# Patient Record
Sex: Female | Born: 1950
Health system: Southern US, Community
[De-identification: ages and names within clinical notes are randomized; demographics above are authoritative.]

## PROBLEM LIST (undated history)

## (undated) DIAGNOSIS — H409 Unspecified glaucoma: Secondary | ICD-10-CM

## (undated) DIAGNOSIS — E785 Hyperlipidemia, unspecified: Secondary | ICD-10-CM

## (undated) DIAGNOSIS — I10 Essential (primary) hypertension: Secondary | ICD-10-CM

## (undated) DIAGNOSIS — Z973 Presence of spectacles and contact lenses: Secondary | ICD-10-CM

## (undated) DIAGNOSIS — E559 Vitamin D deficiency, unspecified: Secondary | ICD-10-CM

## (undated) DIAGNOSIS — B019 Varicella without complication: Secondary | ICD-10-CM

## (undated) HISTORY — DX: Hyperlipidemia, unspecified: E78.5

## (undated) HISTORY — DX: Essential (primary) hypertension: I10

## (undated) HISTORY — DX: Varicella without complication: B01.9

## (undated) HISTORY — DX: Unspecified glaucoma: H40.9

## (undated) HISTORY — DX: Presence of spectacles and contact lenses: Z97.3

## (undated) HISTORY — DX: Vitamin D deficiency, unspecified: E55.9

---

## 2016-08-07 DIAGNOSIS — Z13220 Encounter for screening for lipoid disorders: Secondary | ICD-10-CM | POA: Diagnosis not present

## 2016-08-07 DIAGNOSIS — I10 Essential (primary) hypertension: Secondary | ICD-10-CM | POA: Diagnosis not present

## 2016-08-07 DIAGNOSIS — Z6830 Body mass index (BMI) 30.0-30.9, adult: Secondary | ICD-10-CM | POA: Diagnosis not present

## 2016-09-29 DIAGNOSIS — Z1231 Encounter for screening mammogram for malignant neoplasm of breast: Secondary | ICD-10-CM | POA: Diagnosis not present

## 2016-09-29 DIAGNOSIS — N6489 Other specified disorders of breast: Secondary | ICD-10-CM | POA: Diagnosis not present

## 2016-10-13 DIAGNOSIS — Z7182 Exercise counseling: Secondary | ICD-10-CM | POA: Diagnosis not present

## 2016-10-13 DIAGNOSIS — Z1389 Encounter for screening for other disorder: Secondary | ICD-10-CM | POA: Diagnosis not present

## 2016-10-13 DIAGNOSIS — Z7189 Other specified counseling: Secondary | ICD-10-CM | POA: Diagnosis not present

## 2016-10-13 DIAGNOSIS — I10 Essential (primary) hypertension: Secondary | ICD-10-CM | POA: Diagnosis not present

## 2016-10-13 DIAGNOSIS — Z713 Dietary counseling and surveillance: Secondary | ICD-10-CM | POA: Diagnosis not present

## 2016-10-13 DIAGNOSIS — Z0000 Encounter for general adult medical examination without abnormal findings: Secondary | ICD-10-CM | POA: Diagnosis not present

## 2016-10-13 DIAGNOSIS — Z6830 Body mass index (BMI) 30.0-30.9, adult: Secondary | ICD-10-CM | POA: Diagnosis not present

## 2016-10-13 DIAGNOSIS — Z2821 Immunization not carried out because of patient refusal: Secondary | ICD-10-CM | POA: Diagnosis not present

## 2016-10-22 DIAGNOSIS — R922 Inconclusive mammogram: Secondary | ICD-10-CM | POA: Diagnosis not present

## 2016-10-22 DIAGNOSIS — N6489 Other specified disorders of breast: Secondary | ICD-10-CM | POA: Diagnosis not present

## 2017-02-04 DIAGNOSIS — Z6830 Body mass index (BMI) 30.0-30.9, adult: Secondary | ICD-10-CM | POA: Diagnosis not present

## 2017-02-04 DIAGNOSIS — G4700 Insomnia, unspecified: Secondary | ICD-10-CM | POA: Diagnosis not present

## 2017-02-04 DIAGNOSIS — I10 Essential (primary) hypertension: Secondary | ICD-10-CM | POA: Diagnosis not present

## 2017-05-27 DIAGNOSIS — J029 Acute pharyngitis, unspecified: Secondary | ICD-10-CM | POA: Diagnosis not present

## 2017-09-28 ENCOUNTER — Ambulatory Visit (INDEPENDENT_AMBULATORY_CARE_PROVIDER_SITE_OTHER): Payer: Medicare Other | Admitting: Internal Medicine

## 2017-09-28 ENCOUNTER — Encounter: Payer: Self-pay | Admitting: Internal Medicine

## 2017-09-28 VITALS — BP 168/98 | HR 65 | Temp 98.2°F | Ht 63.75 in | Wt 157.4 lb

## 2017-09-28 DIAGNOSIS — L309 Dermatitis, unspecified: Secondary | ICD-10-CM | POA: Diagnosis not present

## 2017-09-28 DIAGNOSIS — I10 Essential (primary) hypertension: Secondary | ICD-10-CM

## 2017-09-28 DIAGNOSIS — Z1329 Encounter for screening for other suspected endocrine disorder: Secondary | ICD-10-CM | POA: Diagnosis not present

## 2017-09-28 DIAGNOSIS — Z1231 Encounter for screening mammogram for malignant neoplasm of breast: Secondary | ICD-10-CM | POA: Diagnosis not present

## 2017-09-28 LAB — CBC WITH DIFFERENTIAL/PLATELET
BASOS ABS: 0.1 10*3/uL (ref 0.0–0.1)
Basophils Relative: 1 % (ref 0.0–3.0)
Eosinophils Absolute: 0.1 10*3/uL (ref 0.0–0.7)
Eosinophils Relative: 1.4 % (ref 0.0–5.0)
HCT: 42.7 % (ref 36.0–46.0)
Hemoglobin: 14.3 g/dL (ref 12.0–15.0)
LYMPHS ABS: 1.9 10*3/uL (ref 0.7–4.0)
Lymphocytes Relative: 29.8 % (ref 12.0–46.0)
MCHC: 33.5 g/dL (ref 30.0–36.0)
MCV: 85.8 fl (ref 78.0–100.0)
MONO ABS: 0.5 10*3/uL (ref 0.1–1.0)
MONOS PCT: 8 % (ref 3.0–12.0)
NEUTROS ABS: 3.7 10*3/uL (ref 1.4–7.7)
NEUTROS PCT: 59.8 % (ref 43.0–77.0)
PLATELETS: 294 10*3/uL (ref 150.0–400.0)
RBC: 4.98 Mil/uL (ref 3.87–5.11)
RDW: 13.8 % (ref 11.5–15.5)
WBC: 6.2 10*3/uL (ref 4.0–10.5)

## 2017-09-28 LAB — COMPREHENSIVE METABOLIC PANEL
ALK PHOS: 64 U/L (ref 39–117)
ALT: 16 U/L (ref 0–35)
AST: 15 U/L (ref 0–37)
Albumin: 4.2 g/dL (ref 3.5–5.2)
BILIRUBIN TOTAL: 0.7 mg/dL (ref 0.2–1.2)
BUN: 14 mg/dL (ref 6–23)
CO2: 30 meq/L (ref 19–32)
Calcium: 9.7 mg/dL (ref 8.4–10.5)
Chloride: 103 mEq/L (ref 96–112)
Creatinine, Ser: 0.81 mg/dL (ref 0.40–1.20)
GFR: 75.11 mL/min (ref >60.00)
GLUCOSE: 112 mg/dL — AB (ref 70–99)
Potassium: 4.1 mEq/L (ref 3.5–5.1)
SODIUM: 139 meq/L (ref 135–145)
TOTAL PROTEIN: 7.1 g/dL (ref 6.0–8.3)

## 2017-09-28 LAB — POCT URINALYSIS DIPSTICK
Bilirubin, UA: NEGATIVE
Blood, UA: NEGATIVE
Glucose, UA: NEGATIVE
KETONES UA: NEGATIVE
Leukocytes, UA: NEGATIVE
Nitrite, UA: NEGATIVE
PH UA: 7 (ref 5.0–8.0)
PROTEIN UA: NEGATIVE
SPEC GRAV UA: 1.02 (ref 1.010–1.025)
UROBILINOGEN UA: 0.2 U/dL

## 2017-09-28 LAB — LIPID PANEL
CHOLESTEROL: 206 mg/dL — AB (ref 0–200)
HDL: 50.4 mg/dL (ref >39.00)
LDL Cholesterol: 129 mg/dL — ABNORMAL HIGH (ref 0–99)
NONHDL: 155.16
Total CHOL/HDL Ratio: 4
Triglycerides: 130 mg/dL (ref 0.0–149.0)
VLDL: 26 mg/dL (ref 0.0–40.0)

## 2017-09-28 LAB — TSH: TSH: 3.27 u[IU]/mL (ref 0.35–4.50)

## 2017-09-28 LAB — T4, FREE: FREE T4: 0.76 ng/dL (ref 0.60–1.60)

## 2017-09-28 MED ORDER — AMLODIPINE BESYLATE 5 MG PO TABS
5.0000 mg | ORAL_TABLET | Freq: Every day | ORAL | 0 refills | Status: DC
Start: 2017-09-28 — End: 2017-09-29

## 2017-09-28 NOTE — Progress Notes (Addendum)
Chief Complaint  Patient presents with  . Establish Care  . Hypertension   New Patient 1. Needs refill on BP med with move to [place] 5-6 months ago has not established PCP and been out of Metoprolol 25 qd x 1 week. She has been on this medication x 15 years and Lisinopril caused cough where she could not talk. Home BP readings have been 160s/70s.  2. No other complaints here to establish care   Hypertension  Pertinent negatives include no chest pain or shortness of breath.   Review of Systems  Constitutional: Positive for weight loss.       Intentionally lost 12 # via exercise   Respiratory: Negative for shortness of breath.   Cardiovascular: Negative for chest pain.  Gastrointestinal: Negative for abdominal pain and blood in stool.  Psychiatric/Behavioral: Negative for depression.   Past Medical History:  Diagnosis Date  . Chicken pox   . Hypertension    Past Surgical History:  Procedure Laterality Date  . CESAREAN SECTION     x 1    Family History  Problem Relation Age of Onset  . Stroke Mother   . Hypertension Mother   . Diabetes Mother   . Cancer Father        prostate   . Hyperlipidemia Father   . Diabetes Father   . Hyperlipidemia Other        grandparents    Social History   Socioeconomic History  . Marital status: Married    Spouse name: Not on file  . Number of children: Not on file  . Years of education: Not on file  . Highest education level: Not on file  Social Needs  . Financial resource strain: Not on file  . Food insecurity - worry: Not on file  . Food insecurity - inability: Not on file  . Transportation needs - medical: Not on file  . Transportation needs - non-medical: Not on file  Occupational History  . Occupation: retired  Tobacco Use  . Smoking status: Never Smoker  . Smokeless tobacco: Never Used  Substance and Sexual Activity  . Alcohol use: Yes    Comment: occasional  . Drug use: No  . Sexual activity: Not on file  Other Topics  Concern  . Not on file  Social History Narrative   Retired (previous jobs Science writer, private investigation fraud, [employer]   Lived in [place] for a while with husband who is from there   Lived all over [place]. From [place]    1 son lives in [place] now but lived [place] prior with 1 grandson in [place] and 1 grandson in [place]. Wine denies cig/drugs    Likes to exercise    Sexually active with current husband since age 75 y.o    Current Meds  Medication Sig  . magnesium 30 MG tablet Take 30 mg daily by mouth.  . vitamin B-12 (CYANOCOBALAMIN) 100 MCG tablet Take 100 mcg daily by mouth.  . [DISCONTINUED] metoprolol tartrate (LOPRESSOR) 25 mg/10 mL SUSP Take by mouth.   Allergies  Allergen Reactions  . Lisinopril     Cough    Vitals:   09/28/17 0910  Weight: 157 lb 6 oz (71.4 kg)  Height: 5' 3.75" (1.619 m)   Recent Results (from the past 2160 hour(s))  POCT urinalysis dipstick     Status: Normal   Collection Time: 09/28/17 10:19 AM  Result Value Ref Range   Color, UA yellow  Clarity, UA clear    Glucose, UA negative    Bilirubin, UA negative    Ketones, UA negative    Spec Grav, UA 1.020 1.010 - 1.025   Blood, UA negative    pH, UA 7.0 5.0 - 8.0   Protein, UA negative    Urobilinogen, UA 0.2 0.2 or 1.0 E.U./dL   Nitrite, UA negative    Leukocytes, UA Negative Negative   Vitals:   09/28/17 0910  BP: (!) 168/98  Pulse: 65  Temp: 98.2 F (36.8 C)  SpO2: 98%   Objective  Physical Exam  Constitutional: She is oriented to person, place, and time and well-developed, well-nourished, and in no distress.  HENT:  Head: Normocephalic and atraumatic.  Mouth/Throat: Oropharynx is clear and moist and mucous membranes are normal.  Eyes: Conjunctivae are normal. Pupils are equal, round, and reactive to light.  Cardiovascular: Normal rate, regular rhythm, S1 normal, S2 normal and normal heart sounds.  Neg leg edema b/l   Pulmonary/Chest: Effort normal and breath sounds  normal.  Abdominal: Soft. Bowel sounds are normal.  Neurological: She is alert and oriented to person, place, and time. Gait normal.  Skin: Skin is warm and dry.  Erythema to neck mild   Psychiatric: Mood, memory, affect and judgment normal.  Nursing note and vitals reviewed.  Assessment   1. HTN, uncontrolled  2. Mild eczematous change to neck ? etiology  3. HM Plan  1. Add norvasc 5 mg do 2.5 mg qam x 3 days then 5 mg qd  D/c Metoprol 25 mg qd  Reviewed records 10/09/17 prev she was on norvasc 5 mg qd, hctz 12.5 mg qd, Metoprolol 25 mg bid not related also on Ambien 5 mg qhs   F/u in 2 weeks  Labs today CMET, CBC, lipid, UA, TSH, free T4  Consider Hep B/C screen in future declines STD check been with husband since age 51 y.o  -she is unsure if previous PCP did above  2.try OTC HC if not resolved will do stronger steroid at f/ u 3.declines flu shot  Due for Tdap had been 10 years Will disc prevnar and pna 23 and shingrix at f/u  mammo last had 10/2016 dense breast tissue per pt will order repeat but need to get records from [place]  -records obtained and reviewed 10/09/17 neg   Colonoscopy 5 years ago need records from [facility] as well as [place] records of DEXA  Do pap at f/u last 10/2015 no h/o abnormal and LMP 08/2005  Due for eye exam last had 2015 wears glasses  Need to get PCP records [facility] Dr. Jaci Lazier in [place]   Provider: Dr. Olivia Mackie McLean-Scocuzza

## 2017-09-28 NOTE — Patient Instructions (Addendum)
1. Please follow up in 2 weeks can schedule pap smear at this visit  2. Start Norvasc 5 mg (Take 1/2 pill in am x 3 days then 1 pill in am)  3. We need to get records from Dr. Jaci Lazier and mammogram in [place] and colonoscopy in [place]  4. Will check labs today  5. Will refer for mammogram  Hypertension Hypertension is another name for high blood pressure. High blood pressure forces your heart to work harder to pump blood. This can cause problems over time. There are two numbers in a blood pressure reading. There is a top number (systolic) over a bottom number (diastolic). It is best to have a blood pressure below 120/80. Healthy choices can help lower your blood pressure. You may need medicine to help lower your blood pressure if:  Your blood pressure cannot be lowered with healthy choices.  Your blood pressure is higher than 130/80.  Follow these instructions at home: Eating and drinking  If directed, follow the DASH eating plan. This diet includes: ? Filling half of your plate at each meal with fruits and vegetables. ? Filling one quarter of your plate at each meal with whole grains. Whole grains include whole wheat pasta, brown rice, and whole grain bread. ? Eating or drinking low-fat dairy products, such as skim milk or low-fat yogurt. ? Filling one quarter of your plate at each meal with low-fat (lean) proteins. Low-fat proteins include fish, skinless chicken, eggs, beans, and tofu. ? Avoiding fatty meat, cured and processed meat, or chicken with skin. ? Avoiding premade or processed food.  Eat less than 1,500 mg of salt (sodium) a day.  Limit alcohol use to no more than 1 drink a day for nonpregnant women and 2 drinks a day for men. One drink equals 12 oz of beer, 5 oz of wine, or 1 oz of hard liquor. Lifestyle  Work with your doctor to stay at a healthy weight or to lose weight. Ask your doctor what the best weight is for you.  Get at least 30 minutes of exercise that causes  your heart to beat faster (aerobic exercise) most days of the week. This may include walking, swimming, or biking.  Get at least 30 minutes of exercise that strengthens your muscles (resistance exercise) at least 3 days a week. This may include lifting weights or pilates.  Do not use any products that contain nicotine or tobacco. This includes cigarettes and e-cigarettes. If you need help quitting, ask your doctor.  Check your blood pressure at home as told by your doctor.  Keep all follow-up visits as told by your doctor. This is important. Medicines  Take over-the-counter and prescription medicines only as told by your doctor. Follow directions carefully.  Do not skip doses of blood pressure medicine. The medicine does not work as well if you skip doses. Skipping doses also puts you at risk for problems.  Ask your doctor about side effects or reactions to medicines that you should watch for. Contact a doctor if:  You think you are having a reaction to the medicine you are taking.  You have headaches that keep coming back (recurring).  You feel dizzy.  You have swelling in your ankles.  You have trouble with your vision. Get help right away if:  You get a very bad headache.  You start to feel confused.  You feel weak or numb.  You feel faint.  You get very bad pain in your: ? Chest. ? Belly (  abdomen).  You throw up (vomit) more than once.  You have trouble breathing. Summary  Hypertension is another name for high blood pressure.  Making healthy choices can help lower blood pressure. If your blood pressure cannot be controlled with healthy choices, you may need to take medicine. This information is not intended to replace advice given to you by your health care provider. Make sure you discuss any questions you have with your health care provider. Document Released: 04/14/2008 Document Revised: 09/24/2016 Document Reviewed: 09/24/2016 Elsevier Interactive Patient  Education  [employer].

## 2017-09-29 ENCOUNTER — Telehealth: Payer: Self-pay | Admitting: Internal Medicine

## 2017-09-29 MED ORDER — AMLODIPINE BESYLATE 5 MG PO TABS: 5.0000 mg | ORAL_TABLET | Freq: Every day | ORAL | 0 refills | Status: DC

## 2017-09-29 NOTE — Telephone Encounter (Signed)
Pt called to make aware prescription was printed at the office and could be faxed to [facility] as previously requested. Pt states she would rather pick up the printed prescription from the office rather than having it faxed to pharmacy. Trisha,Flow Coordinator made aware and will have prescription ready for pick up for the pt today Pt voiced no other concerns at this time.

## 2017-09-29 NOTE — Addendum Note (Signed)
Addended by: Orland Mustard on: 09/29/2017 02:51 PM   Modules accepted: Orders

## 2017-09-29 NOTE — Telephone Encounter (Signed)
Copied from [place] [id number]. Topic: Quick Communication - See Telephone Encounter >> Sep 29, 2017  1:29 PM Vernona Rieger wrote: CRM for notification. See Telephone encounter for:  Patient is calling stating that she would like the Dr to write script for amLODipine 5mg  tablets, she likes to shop around for the cheapest and so she wants to know if someone could call her back. She seen Dr Aundra Dubin yesterday & she prescribed her this & it was going to be sent to [facility].   09/29/17.

## 2017-10-12 ENCOUNTER — Ambulatory Visit: Payer: PRIVATE HEALTH INSURANCE | Admitting: Internal Medicine

## 2017-10-19 ENCOUNTER — Ambulatory Visit: Payer: PRIVATE HEALTH INSURANCE | Admitting: Internal Medicine

## 2017-10-22 ENCOUNTER — Other Ambulatory Visit (HOSPITAL_COMMUNITY)
Admission: RE | Admit: 2017-10-22 | Discharge: 2017-10-22 | Disposition: A | Discharge status: Home / Self Care | Payer: Medicare Other | Source: Ambulatory Visit | Attending: Internal Medicine | Admitting: Internal Medicine

## 2017-10-22 ENCOUNTER — Ambulatory Visit (INDEPENDENT_AMBULATORY_CARE_PROVIDER_SITE_OTHER): Payer: Medicare Other | Admitting: Internal Medicine

## 2017-10-22 ENCOUNTER — Encounter: Payer: Self-pay | Admitting: Internal Medicine

## 2017-10-22 VITALS — BP 160/86 | HR 75 | Temp 98.4°F | Ht 63.75 in | Wt 157.4 lb

## 2017-10-22 DIAGNOSIS — M546 Pain in thoracic spine: Secondary | ICD-10-CM

## 2017-10-22 DIAGNOSIS — Z124 Encounter for screening for malignant neoplasm of cervix: Secondary | ICD-10-CM | POA: Insufficient documentation

## 2017-10-22 DIAGNOSIS — I10 Essential (primary) hypertension: Secondary | ICD-10-CM | POA: Diagnosis not present

## 2017-10-22 DIAGNOSIS — E785 Hyperlipidemia, unspecified: Secondary | ICD-10-CM | POA: Insufficient documentation

## 2017-10-22 DIAGNOSIS — M549 Dorsalgia, unspecified: Secondary | ICD-10-CM | POA: Insufficient documentation

## 2017-10-22 MED ORDER — CHLORTHALIDONE 25 MG PO TABS: 12.5000 mg | ORAL_TABLET | Freq: Every day | ORAL | 0 refills | Status: DC

## 2017-10-22 MED ORDER — AMLODIPINE BESYLATE 5 MG PO TABS: 2.5000 mg | ORAL_TABLET | Freq: Two times a day (BID) | ORAL | 0 refills | Status: DC

## 2017-10-22 NOTE — Patient Instructions (Addendum)
You can try Red yeast rice for cholesterol but no benefit other than lowering cholesterol like statin. Statin can lower cholesterol and reduce risk of stroke and heart attack  Take Norvasc 5 mg (1/2 pill=2.5 mg)2x per day  Add Chlorthalidone 12.5 mg in am  Follow up in 2 weeks  Remember to get eyes checked    Cholesterol Cholesterol is a white, waxy, fat-like substance that is needed by the human body in small amounts. The liver makes all the cholesterol we need. Cholesterol is carried from the liver by the blood through the blood vessels. Deposits of cholesterol (plaques) may build up on blood vessel (artery) walls. Plaques make the arteries narrower and stiffer. Cholesterol plaques increase the risk for heart attack and stroke. You cannot feel your cholesterol level even if it is very high. The only way to know that it is high is to have a blood test. Once you know your cholesterol levels, you should keep a record of the test results. Work with your health care provider to keep your levels in the desired range. What do the results mean?  Total cholesterol is a rough measure of all the cholesterol in your blood.  LDL (low-density lipoprotein) is the "bad" cholesterol. This is the type that causes plaque to build up on the artery walls. You want this level to be low.  HDL (high-density lipoprotein) is the "good" cholesterol because it cleans the arteries and carries the LDL away. You want this level to be high.  Triglycerides are fat that the body can either burn for energy or store. High levels are closely linked to heart disease. What are the desired levels of cholesterol?  Total cholesterol below 200.  LDL below 100 for people who are at risk, below 70 for people at very high risk.  HDL above 40 is good. A level of 60 or higher is considered to be protective against heart disease.  Triglycerides below 150. How can I lower my cholesterol? Diet Follow your diet program as told by  your health care provider.  Choose fish or white meat chicken and [place], roasted or baked. Limit fatty cuts of red meat, fried foods, and processed meats, such as sausage and lunch meats.  Eat lots of fresh fruits and vegetables.  Choose whole grains, beans, pasta, potatoes, and cereals.  Choose olive oil, corn oil, or canola oil, and use only small amounts.  Avoid butter, mayonnaise, shortening, or palm kernel oils.  Avoid foods with trans fats.  Drink skim or nonfat milk and eat low-fat or nonfat yogurt and cheeses. Avoid whole milk, cream, ice cream, egg yolks, and full-fat cheeses.  Healthier desserts include angel food cake, ginger snaps, animal crackers, hard candy, popsicles, and low-fat or nonfat frozen yogurt. Avoid pastries, cakes, pies, and cookies.  Exercise  Follow your exercise program as told by your health care provider. A regular program: ? Helps to decrease LDL and raise HDL. ? Helps with weight control.  Do things that increase your activity level, such as gardening, walking, and taking the stairs.  Ask your health care provider about ways that you can be more active in your daily life.  Medicine  Take over-the-counter and prescription medicines only as told by your health care provider. ? Medicine may be prescribed by your health care provider to help lower cholesterol and decrease the risk for heart disease. This is usually done if diet and exercise have failed to bring down cholesterol levels. ? If you have several  risk factors, you may need medicine even if your levels are normal.  This information is not intended to replace advice given to you by your health care provider. Make sure you discuss any questions you have with your health care provider. Document Released: 07/22/2001 Document Revised: 05/24/2016 Document Reviewed: 04/26/2016 Elsevier Interactive Patient Education  2017 [employer].  Chlorthalidone tablets What is this  medicine? CHLORTHALIDONE (klor THAL i done) is a diuretic. It increases the amount of urine passed, which causes the body to lose salt and water. This medicine is used to treat high blood pressure and edema or water retention. This medicine may be used for other purposes; ask your health care provider or pharmacist if you have questions. COMMON BRAND NAME(S): Thalitone What should I tell my health care provider before I take this medicine? They need to know if you have any of these conditions: -asthma -diabetes -gout -kidney disease -liver disease -parathyroid disease -systemic lupus erythematosus (SLE) -taking cortisone, digoxin, lithium carbonate, or drugs for diabetes -an unusual or allergic reaction to chlorthalidone, sulfa drugs, other medicines, foods, dyes, or preservatives -pregnant or trying to get pregnant -breast-feeding How should I use this medicine? Take this medicine by mouth with a glass of water. Follow the directions on the prescription label. It is best to take your dose in the morning with food. Take your medicine at regular intervals. Do not take your medicine more often than directed. Do not stop taking except on your doctor's advice. Talk to your pediatrician regarding the use of this medicine in children. Special care may be needed. Overdosage: If you think you have taken too much of this medicine contact a poison control center or emergency room at once. NOTE: This medicine is only for you. Do not share this medicine with others. What if I miss a dose? If you miss a dose, take it as soon as you can. If it is almost time for your next dose, take only that dose. Do not take double or extra doses. What may interact with this medicine? -barbiturate medicines for sleep or seizure control -digoxin -lithium -medicines for diabetes -norepinephrine -other medicines for high blood pressure -some pain medicines -steroid hormones like prednisone, cortisone,  hydrocortisone, corticotropin -tubocurarine This list may not describe all possible interactions. Give your health care provider a list of all the medicines, herbs, non-prescription drugs, or dietary supplements you use. Also tell them if you smoke, drink alcohol, or use illegal drugs. Some items may interact with your medicine. What should I watch for while using this medicine? Visit your doctor or health care professional for regular check ups. Check your blood pressure as directed. Ask your doctor or health care professional what your blood pressure should be and when you should contact him or her. You may need to be on a special diet while taking this medicine. Ask your doctor. You may get drowsy or dizzy. Do not drive, use machinery, or do anything that needs mental alertness until you know how this medicine affects you. Do not stand or sit up quickly, especially if you are an older patient. This reduces the risk of dizzy or fainting spells. Alcohol may interfere with the effect of this medicine. Avoid alcoholic drinks. This medicine may affect your blood sugar level. If you have diabetes, check with your doctor or health care professional before changing the dose of your diabetic medicine. This medicine can make you more sensitive to the sun. Keep out of the sun. If you cannot avoid  being in the sun, wear protective clothing and use sunscreen. Do not use sun lamps or tanning beds/booths. What side effects may I notice from receiving this medicine? Side effects that you should report to your doctor or health care professional as soon as possible: -allergic reactions like skin rash, itching or hives, swelling of the face, lips, or tongue -dark urine -dry mouth -excess thirst -fast, irregular heart rate -fever, chills -muscle pain, cramps, or spasm -nausea, vomiting -redness, blistering, peeling or loosening of the skin, including inside the mouth -tingling, pain or numbness in the hands or  feet -unusually weak or tired -yellowing of the eyes or skin Side effects that usually do not require medical attention (report to your doctor or health care professional if they continue or are bothersome): -diarrhea or constipation -headache -impotence -loss of appetite -stomach upset This list may not describe all possible side effects. Call your doctor for medical advice about side effects. You may report side effects to FDA at 1-800-FDA-1088. Where should I keep my medicine? Keep out of the reach of children. Store at room temperature between 15 and 30 degrees C (59 and 86 degrees F). Keep container tightly closed. Throw away any unused medicine after the expiration date. NOTE: This sheet is a summary. It may not cover all possible information. If you have questions about this medicine, talk to your doctor, pharmacist, or health care provider.  2018 Elsevier/Gold Standard (2008-02-01 15:28:48)

## 2017-10-22 NOTE — Progress Notes (Addendum)
Chief Complaint  Patient presents with  . pap screening   F/u  1. HTN not controlled on Norvasc taking 2.5 mg bid instead of 5 mg qd b/c she feels like she needs control in the pm . Reviewed prev. Notes and was on Metoprolol 25 mg bid and HCT 12.5 mg and norvasc 5 mg  2. C/o mid back pain x 2 days dull pain she thinks she slept the wrong way and grandson has been sleeping with her  3. Here for pap today  4. Reviewed labs cholesterol elevated pt wants to try lifestyle changes 1st and doesn't really want to take statin.     Review of Systems  Respiratory: Negative for shortness of breath.   Cardiovascular: Negative for chest pain.  Musculoskeletal: Positive for back pain.  Skin: Negative for rash.  Neurological: Negative for headaches.  Psychiatric/Behavioral: The patient has insomnia.    Past Medical History:  Diagnosis Date  . Chicken pox   . Hypertension   . Wears glasses    Past Surgical History:  Procedure Laterality Date  . CESAREAN SECTION     x 1    Family History  Problem Relation Age of Onset  . Stroke Mother   . Hypertension Mother   . Diabetes Mother   . Cancer Father        prostate   . Hyperlipidemia Father   . Diabetes Father   . Hyperlipidemia Other        grandparents    Social History   Socioeconomic History  . Marital status: Married    Spouse name: Not on file  . Number of children: Not on file  . Years of education: Not on file  . Highest education level: Not on file  Social Needs  . Financial resource strain: Not on file  . Food insecurity - worry: Not on file  . Food insecurity - inability: Not on file  . Transportation needs - medical: Not on file  . Transportation needs - non-medical: Not on file  Occupational History  . Occupation: retired  Tobacco Use  . Smoking status: Never Smoker  . Smokeless tobacco: Never Used  Substance and Sexual Activity  . Alcohol use: Yes    Comment: occasional  . Drug use: No  . Sexual activity: Not  on file  Other Topics Concern  . Not on file  Social History Narrative   Retired (previous jobs Science writer, private investigation fraud, [employer]   Lived in [place] for a while with husband who is from there   Lived all over [place]. From [place]    1 son lives in [place] now but lived [place] prior with 1 grandson in [place] and 1 grandson in [place]. Wine denies cig/drugs    Likes to exercise    Sexually active with current husband since age 81 y.o    Family origin from [place]  . amLODipine (NORVASC) 5 MG tablet Take 0.5 tablets (2.5 mg total) by mouth 2 (two) times daily. Take 1/2 tablet 2x per day  . magnesium 30 MG tablet Take 30 mg daily by mouth.  . vitamin B-12 (CYANOCOBALAMIN) 100 MCG tablet Take 100 mcg daily by mouth.  . [DISCONTINUED] amLODipine (NORVASC) 5 MG tablet Take 1 tablet (5 mg total) by mouth daily. Take 1/2 tablet x 3 days then 1 tablet qam   Allergies  Allergen Reactions  . Lisinopril  Cough    Recent Results (from the past 2160 hour(s))  Comprehensive metabolic panel     Status: Abnormal   Collection Time: 09/28/17 10:02 AM  Result Value Ref Range   Sodium 139 135 - 145 mEq/L   Potassium 4.1 3.5 - 5.1 mEq/L   Chloride 103 96 - 112 mEq/L   CO2 30 19 - 32 mEq/L   Glucose, Bld 112 (H) 70 - 99 mg/dL   BUN 14 6 - 23 mg/dL   Creatinine, Ser 0.81 0.40 - 1.20 mg/dL   Total Bilirubin 0.7 0.2 - 1.2 mg/dL   Alkaline Phosphatase 64 39 - 117 U/L   AST 15 0 - 37 U/L   ALT 16 0 - 35 U/L   Total Protein 7.1 6.0 - 8.3 g/dL   Albumin 4.2 3.5 - 5.2 g/dL   Calcium 9.7 8.4 - 10.5 mg/dL   GFR 75.11 >60.00 mL/min  CBC with Differential/Platelet     Status: None   Collection Time: 09/28/17 10:02 AM  Result Value Ref Range   WBC 6.2 4.0 - 10.5 K/uL   RBC 4.98 3.87 - 5.11 Mil/uL   Hemoglobin 14.3 12.0 - 15.0 g/dL   HCT 42.7 36.0 - 46.0 %   MCV 85.8 78.0 - 100.0 fl   MCHC 33.5 30.0 - 36.0 g/dL   RDW 13.8 11.5 - 15.5 %    Platelets 294.0 150.0 - 400.0 K/uL   Neutrophils Relative % 59.8 43.0 - 77.0 %   Lymphocytes Relative 29.8 12.0 - 46.0 %   Monocytes Relative 8.0 3.0 - 12.0 %   Eosinophils Relative 1.4 0.0 - 5.0 %   Basophils Relative 1.0 0.0 - 3.0 %   Neutro Abs 3.7 1.4 - 7.7 K/uL   Lymphs Abs 1.9 0.7 - 4.0 K/uL   Monocytes Absolute 0.5 0.1 - 1.0 K/uL   Eosinophils Absolute 0.1 0.0 - 0.7 K/uL   Basophils Absolute 0.1 0.0 - 0.1 K/uL  Lipid panel     Status: Abnormal   Collection Time: 09/28/17 10:02 AM  Result Value Ref Range   Cholesterol 206 (H) 0 - 200 mg/dL    Comment: ATP III Classification       Desirable:  < 200 mg/dL               Borderline High:  200 - 239 mg/dL          High:  > = 240 mg/dL   Triglycerides 130.0 0.0 - 149.0 mg/dL    Comment: Normal:  <150 mg/dLBorderline High:  150 - 199 mg/dL   HDL 50.40 >39.00 mg/dL   VLDL 26.0 0.0 - 40.0 mg/dL   LDL Cholesterol 129 (H) 0 - 99 mg/dL   Total CHOL/HDL Ratio 4     Comment:                Men          Women1/2 Average Risk     3.4          3.3Average Risk          5.0          4.42X Average Risk          9.6          7.13X Average Risk          15.0          11.0  NonHDL 155.16     Comment: NOTE:  Non-HDL goal should be 30 mg/dL higher than patient's LDL goal (i.e. LDL goal of < 70 mg/dL, would have non-HDL goal of < 100 mg/dL)  T4, free     Status: None   Collection Time: 09/28/17 10:02 AM  Result Value Ref Range   Free T4 0.76 0.60 - 1.60 ng/dL    Comment: Specimens from patients who are undergoing biotin therapy and /or ingesting biotin supplements may contain high levels of biotin.  The higher biotin concentration in these specimens interferes with this Free T4 assay.  Specimens that contain high levels  of biotin may cause false high results for this Free T4 assay.  Please interpret results in light of the total clinical presentation of the patient.    TSH     Status: None   Collection Time: 09/28/17 10:02 AM   Result Value Ref Range   TSH 3.27 0.35 - 4.50 uIU/mL  POCT urinalysis dipstick     Status: Normal   Collection Time: 09/28/17 10:19 AM  Result Value Ref Range   Color, UA yellow    Clarity, UA clear    Glucose, UA negative    Bilirubin, UA negative    Ketones, UA negative    Spec Grav, UA 1.020 1.010 - 1.025   Blood, UA negative    pH, UA 7.0 5.0 - 8.0   Protein, UA negative    Urobilinogen, UA 0.2 0.2 or 1.0 E.U./dL   Nitrite, UA negative    Leukocytes, UA Negative Negative   Objective  Body mass index is 27.23 kg/m. Wt Readings from Last 3 Encounters:  10/22/17 157 lb 6 oz (71.4 kg)  09/28/17 157 lb 6 oz (71.4 kg)   Temp Readings from Last 3 Encounters:  10/22/17 98.4 F (36.9 C) (Oral)  09/28/17 98.2 F (36.8 C) (Oral)   BP Readings from Last 3 Encounters:  10/22/17 (!) 160/86  09/28/17 (!) 168/98   Pulse Readings from Last 3 Encounters:  10/22/17 75  09/28/17 65   O2 room air 98%  Physical Exam  Constitutional: She is oriented to person, place, and time and well-developed, well-nourished, and in no distress.  HENT:  Head: Normocephalic and atraumatic.  Mouth/Throat: Oropharynx is clear and moist and mucous membranes are normal.  Eyes: Conjunctivae are normal. Pupils are equal, round, and reactive to light.  Cardiovascular: Normal rate, regular rhythm and normal heart sounds.  Pulmonary/Chest: Effort normal and breath sounds normal. Right breast exhibits no inverted nipple, no mass, no nipple discharge, no skin change and no tenderness. Left breast exhibits no inverted nipple, no mass, no nipple discharge, no skin change and no tenderness. Breasts are symmetrical.  Neg axillary mass b/l   Abdominal: Soft. Bowel sounds are normal. There is no tenderness.  Genitourinary: Vagina normal, uterus normal, cervix normal, right adnexa normal, left adnexa normal and vulva normal. Cervix exhibits no motion tenderness. No vaginal discharge found.  Neurological: She is  alert and oriented to person, place, and time. Gait normal. Gait normal.  Skin: Skin is warm and dry.  Psychiatric: Mood, memory, affect and judgment normal.  Nursing note and vitals reviewed.  Assessment   1. HTN/HLD  2. Screening pap  3. Mid back pain  4. HM Plan  1.  Pt does not want to increase norvasc dose 2/2 potential side effect leg edema will do Norvasc 2.5 mg bid pt wants to cut 5 mg pill in 1/2 instead of 2.5  mg dose bid   Add chlorthalidone 12.5 mg qd. May need to titrate up if can tolerate  Vs  Add back BB I.e coreg was on Metoprolol 25 mg bid but Coreg does better BP control   Disc lifestyle changes (she already exercises, diet needs to change)  if lipid not improving rec statin. Also disc red yeast rice but no outcome benefit heart attack or stroke. She prefers to look into red yeast rice  Given up to date cholesterol handout   F/u BP in 2 weeks  2. Pap today if normal no need for any further  3. Monitor if continues will need Xray  Prn Tylenol, heat  4.  Declines flu vaccine, declines pnuemonia vaccines.  Tdap due Summer 2019 had 9 years ago  Disc shingrix at f/u   mammo sch 10/27/17  Need to get copy of DEXA obtained and had 09/07/99 T score 0.8 low risk consider repeat DEXA in future given this was done in 2000 and not age 30, colon[place]   Pap today   rec get eye exam she normally goes to [facility] Provider: Dr. Olivia Mackie McLean-Scocuzza-Internal Medicine

## 2017-10-26 LAB — CYTOLOGY - PAP
Diagnosis: NEGATIVE
HPV (WINDOPATH): NOT DETECTED

## 2017-10-26 NOTE — Addendum Note (Signed)
Addended by: Orland Mustard on: 10/26/2017 10:13 PM   Modules accepted: Level of Service

## 2017-10-27 ENCOUNTER — Ambulatory Visit
Admission: RE | Admit: 2017-10-27 | Discharge: 2017-10-27 | Disposition: A | Discharge status: Home / Self Care | Payer: Medicare Other | Source: Ambulatory Visit | Attending: Internal Medicine | Admitting: Internal Medicine

## 2017-10-27 DIAGNOSIS — Z1231 Encounter for screening mammogram for malignant neoplasm of breast: Secondary | ICD-10-CM | POA: Insufficient documentation

## 2017-11-12 ENCOUNTER — Telehealth: Payer: Self-pay | Admitting: Internal Medicine

## 2017-11-12 NOTE — Telephone Encounter (Signed)
Placed in your folder for review. 

## 2017-11-12 NOTE — Telephone Encounter (Signed)
Pt dropped off osteoporosis screening for Dr. Olivia Mackie. Paper is up front in Dr. Audrie Gallus color folder.

## 2017-11-27 ENCOUNTER — Encounter: Payer: Self-pay | Admitting: Internal Medicine

## 2017-11-27 ENCOUNTER — Ambulatory Visit (INDEPENDENT_AMBULATORY_CARE_PROVIDER_SITE_OTHER): Payer: Medicare Other | Admitting: Internal Medicine

## 2017-11-27 VITALS — BP 124/68 | HR 91 | Temp 98.8°F | Ht 63.75 in | Wt 153.5 lb

## 2017-11-27 DIAGNOSIS — E785 Hyperlipidemia, unspecified: Secondary | ICD-10-CM | POA: Diagnosis not present

## 2017-11-27 DIAGNOSIS — I10 Essential (primary) hypertension: Secondary | ICD-10-CM

## 2017-11-27 MED ORDER — AMLODIPINE BESYLATE 5 MG PO TABS: 2.5000 mg | ORAL_TABLET | Freq: Every day | ORAL | 1 refills | Status: DC

## 2017-11-27 MED ORDER — CHLORTHALIDONE 25 MG PO TABS: 25.0000 mg | ORAL_TABLET | Freq: Every day | ORAL | 1 refills | Status: DC

## 2017-11-27 NOTE — Progress Notes (Signed)
Chief Complaint  Patient presents with  . Follow-up   Follow up  1. HTN doing well pt has been taking 1 whole pill chlorthalidone 25 mg qd and norvasc 1/2 pill of 5 mg (2.5) mg bid BP at home has been running 115/65 she has been somewhat lightheaded instructions last visit were 2.5 mg bid and 1/2 chlorthalidone but pt's bp doing well on this dose will continue for nwo    Review of Systems  Constitutional: Negative for weight loss.  Respiratory: Negative for shortness of breath.   Cardiovascular: Negative for chest pain.  Gastrointestinal: Negative for abdominal pain.  Genitourinary: Positive for frequency.  Musculoskeletal: Negative for back pain.   Past Medical History:  Diagnosis Date  . Chicken pox   . Hypertension   . Wears glasses    Past Surgical History:  Procedure Laterality Date  . CESAREAN SECTION     x 1    Family History  Problem Relation Age of Onset  . Stroke Mother   . Hypertension Mother   . Diabetes Mother   . Cancer Father        prostate   . Hyperlipidemia Father   . Diabetes Father   . Hyperlipidemia Other        grandparents   . Breast cancer Neg Hx    Social History   Socioeconomic History  . Marital status: Married    Spouse name: Not on file  . Number of children: Not on file  . Years of education: Not on file  . Highest education level: Not on file  Social Needs  . Financial resource strain: Not on file  . Food insecurity - worry: Not on file  . Food insecurity - inability: Not on file  . Transportation needs - medical: Not on file  . Transportation needs - non-medical: Not on file  Occupational History  . Occupation: retired  Tobacco Use  . Smoking status: Never Smoker  . Smokeless tobacco: Never Used  Substance and Sexual Activity  . Alcohol use: Yes    Comment: occasional  . Drug use: No  . Sexual activity: Not on file  Other Topics Concern  . Not on file  Social History Narrative   Retired (previous jobs Science writer, private  investigation fraud, [employer]   Lived in [place] for a while with husband who is from there   Lived all over [place]. From [place]    1 son lives in [place] now but lived [place] prior with 1 grandson in [place] and 1 grandson in [place]. Wine denies cig/drugs    Likes to exercise    Sexually active with current husband since age 10 y.o    Family origin from [place]    No outpatient medications have been marked as taking for the 11/27/17 encounter (Office Visit) with McLean-Scocuzza, Nino Glow, MD.   Allergies  Allergen Reactions  . Lisinopril     Cough    Recent Results (from the past 2160 hour(s))  Comprehensive metabolic panel     Status: Abnormal   Collection Time: 09/28/17 10:02 AM  Result Value Ref Range   Sodium 139 135 - 145 mEq/L   Potassium 4.1 3.5 - 5.1 mEq/L   Chloride 103 96 - 112 mEq/L   CO2 30 19 - 32 mEq/L   Glucose, Bld 112 (H) 70 - 99 mg/dL   BUN 14 6 - 23 mg/dL   Creatinine, Ser 0.81 0.40 - 1.20 mg/dL  Total Bilirubin 0.7 0.2 - 1.2 mg/dL   Alkaline Phosphatase 64 39 - 117 U/L   AST 15 0 - 37 U/L   ALT 16 0 - 35 U/L   Total Protein 7.1 6.0 - 8.3 g/dL   Albumin 4.2 3.5 - 5.2 g/dL   Calcium 9.7 8.4 - 10.5 mg/dL   GFR 75.11 >60.00 mL/min  CBC with Differential/Platelet     Status: None   Collection Time: 09/28/17 10:02 AM  Result Value Ref Range   WBC 6.2 4.0 - 10.5 K/uL   RBC 4.98 3.87 - 5.11 Mil/uL   Hemoglobin 14.3 12.0 - 15.0 g/dL   HCT 42.7 36.0 - 46.0 %   MCV 85.8 78.0 - 100.0 fl   MCHC 33.5 30.0 - 36.0 g/dL   RDW 13.8 11.5 - 15.5 %   Platelets 294.0 150.0 - 400.0 K/uL   Neutrophils Relative % 59.8 43.0 - 77.0 %   Lymphocytes Relative 29.8 12.0 - 46.0 %   Monocytes Relative 8.0 3.0 - 12.0 %   Eosinophils Relative 1.4 0.0 - 5.0 %   Basophils Relative 1.0 0.0 - 3.0 %   Neutro Abs 3.7 1.4 - 7.7 K/uL   Lymphs Abs 1.9 0.7 - 4.0 K/uL   Monocytes Absolute 0.5 0.1 - 1.0 K/uL   Eosinophils Absolute 0.1 0.0 - 0.7 K/uL   Basophils Absolute 0.1  0.0 - 0.1 K/uL  Lipid panel     Status: Abnormal   Collection Time: 09/28/17 10:02 AM  Result Value Ref Range   Cholesterol 206 (H) 0 - 200 mg/dL    Comment: ATP III Classification       Desirable:  < 200 mg/dL               Borderline High:  200 - 239 mg/dL          High:  > = 240 mg/dL   Triglycerides 130.0 0.0 - 149.0 mg/dL    Comment: Normal:  <150 mg/dLBorderline High:  150 - 199 mg/dL   HDL 50.40 >39.00 mg/dL   VLDL 26.0 0.0 - 40.0 mg/dL   LDL Cholesterol 129 (H) 0 - 99 mg/dL   Total CHOL/HDL Ratio 4     Comment:                Men          Women1/2 Average Risk     3.4          3.3Average Risk          5.0          4.42X Average Risk          9.6          7.13X Average Risk          15.0          11.0                       NonHDL 155.16     Comment: NOTE:  Non-HDL goal should be 30 mg/dL higher than patient's LDL goal (i.e. LDL goal of < 70 mg/dL, would have non-HDL goal of < 100 mg/dL)  T4, free     Status: None   Collection Time: 09/28/17 10:02 AM  Result Value Ref Range   Free T4 0.76 0.60 - 1.60 ng/dL    Comment: Specimens from patients who are undergoing biotin therapy and /or ingesting biotin supplements may contain high levels of biotin.  The higher biotin concentration in these specimens interferes with this Free T4 assay.  Specimens that contain high levels  of biotin may cause false high results for this Free T4 assay.  Please interpret results in light of the total clinical presentation of the patient.    TSH     Status: None   Collection Time: 09/28/17 10:02 AM  Result Value Ref Range   TSH 3.27 0.35 - 4.50 uIU/mL  POCT urinalysis dipstick     Status: Normal   Collection Time: 09/28/17 10:19 AM  Result Value Ref Range   Color, UA yellow    Clarity, UA clear    Glucose, UA negative    Bilirubin, UA negative    Ketones, UA negative    Spec Grav, UA 1.020 1.010 - 1.025   Blood, UA negative    pH, UA 7.0 5.0 - 8.0   Protein, UA negative    Urobilinogen, UA 0.2 0.2  or 1.0 E.U./dL   Nitrite, UA negative    Leukocytes, UA Negative Negative  Cytology - PAP     Status: None   Collection Time: 10/22/17 12:00 AM  Result Value Ref Range   Adequacy      Satisfactory for evaluation  endocervical/transformation zone component PRESENT.   Diagnosis      NEGATIVE FOR INTRAEPITHELIAL LESIONS OR MALIGNANCY.   HPV NOT DETECTED     Comment: Normal Reference Range - NOT Detected   Material Submitted CervicoVaginal Pap [ThinPrep Imaged]    Objective  Body mass index is 26.56 kg/m. Wt Readings from Last 3 Encounters:  11/27/17 153 lb 8 oz (69.6 kg)  10/22/17 157 lb 6 oz (71.4 kg)  09/28/17 157 lb 6 oz (71.4 kg)   Temp Readings from Last 3 Encounters:  11/27/17 98.8 F (37.1 C) (Oral)  10/22/17 98.4 F (36.9 C) (Oral)  09/28/17 98.2 F (36.8 C) (Oral)   BP Readings from Last 3 Encounters:  11/27/17 124/68  10/22/17 (!) 160/86  09/28/17 (!) 168/98   Pulse Readings from Last 3 Encounters:  11/27/17 91  10/22/17 75  09/28/17 65   O2 sat room air 97%  Physical Exam  Constitutional: She is oriented to person, place, and time and well-developed, well-nourished, and in no distress. Vital signs are normal.  HENT:  Head: Normocephalic and atraumatic.  Mouth/Throat: Oropharynx is clear and moist and mucous membranes are normal.  Eyes: Conjunctivae are normal. Pupils are equal, round, and reactive to light.  Cardiovascular: Normal rate, regular rhythm and normal heart sounds.  Pulmonary/Chest: Effort normal and breath sounds normal.  Neurological: She is alert and oriented to person, place, and time. Gait normal. Gait normal.  Skin: Skin is warm, dry and intact.  Psychiatric: Mood, memory, affect and judgment normal.  Nursing note and vitals reviewed.   Assessment   1. HTN/HLD 2. Hm  Plan  1. Check BMET at f/u with lipid and consider statin at f/u if not better lipid panel  Cont chlorthalidone 25 mg qd and Norvasc 2.5 change from bid to qd  2.  Declines flu shot  Hold on shingrix for now disc but pt does not like vaccines  mammo neg 10/27/17  Consider repeat DEXA in future had 2000 normal  Pt willing to do cologaurd in future, pending records [place] 5 [place] ago  Pap 10/28/17 neg neg HPV  Still needs to get eye exam   Provider: Dr. Olivia Mackie McLean-Scocuzza-Internal Medicine

## 2017-11-27 NOTE — Patient Instructions (Signed)
You were taking Norvasc 2.5 mg (1/2 tablet) 2x per day try 2.5 mg (1/2 tablet) 1x per day in the am  With Chlorthalidone 25 mg daily  Take care  See you in 3 months  I know you dont like vaccines but if you are interested consider shingrix for shingles vaccine  Log your blood pressure at home  Consider repeat bone density scan  Try to get colonoscopy from [facility]  Hypertension Hypertension, commonly called high blood pressure, is when the force of blood pumping through the arteries is too strong. The arteries are the blood vessels that carry blood from the heart throughout the body. Hypertension forces the heart to work harder to pump blood and may cause arteries to become narrow or stiff. Having untreated or uncontrolled hypertension can cause heart attacks, strokes, kidney disease, and other problems. A blood pressure reading consists of a higher number over a lower number. Ideally, your blood pressure should be below 120/80. The first ("top") number is called the systolic pressure. It is a measure of the pressure in your arteries as your heart beats. The second ("bottom") number is called the diastolic pressure. It is a measure of the pressure in your arteries as the heart relaxes. What are the causes? The cause of this condition is not known. What increases the risk? Some risk factors for high blood pressure are under your control. Others are not. Factors you can change  Smoking.  Having type 2 diabetes mellitus, high cholesterol, or both.  Not getting enough exercise or physical activity.  Being overweight.  Having too much fat, sugar, calories, or salt (sodium) in your diet.  Drinking too much alcohol. Factors that are difficult or impossible to change  Having chronic kidney disease.  Having a family history of high blood pressure.  Age. Risk increases with age.  Race. You may be at higher risk if you are African-American.  Gender. Men are at higher risk than women before age  7. After age 20, women are at higher risk than men.  Having obstructive sleep apnea.  Stress. What are the signs or symptoms? Extremely high blood pressure (hypertensive crisis) may cause:  Headache.  Anxiety.  Shortness of breath.  Nosebleed.  Nausea and vomiting.  Severe chest pain.  Jerky movements you cannot control (seizures).  How is this diagnosed? This condition is diagnosed by measuring your blood pressure while you are seated, with your arm resting on a surface. The cuff of the blood pressure monitor will be placed directly against the skin of your upper arm at the level of your heart. It should be measured at least twice using the same arm. Certain conditions can cause a difference in blood pressure between your right and left arms. Certain factors can cause blood pressure readings to be lower or higher than normal (elevated) for a short period of time:  When your blood pressure is higher when you are in a health care provider's office than when you are at home, this is called white coat hypertension. Most people with this condition do not need medicines.  When your blood pressure is higher at home than when you are in a health care provider's office, this is called masked hypertension. Most people with this condition may need medicines to control blood pressure.  If you have a high blood pressure reading during one visit or you have normal blood pressure with other risk factors:  You may be asked to return on a different day to have your  blood pressure checked again.  You may be asked to monitor your blood pressure at home for 1 week or longer.  If you are diagnosed with hypertension, you may have other blood or imaging tests to help your health care provider understand your overall risk for other conditions. How is this treated? This condition is treated by making healthy lifestyle changes, such as eating healthy foods, exercising more, and reducing your alcohol  intake. Your health care provider may prescribe medicine if lifestyle changes are not enough to get your blood pressure under control, and if:  Your systolic blood pressure is above 130.  Your diastolic blood pressure is above 80.  Your personal target blood pressure may vary depending on your medical conditions, your age, and other factors. Follow these instructions at home: Eating and drinking  Eat a diet that is high in fiber and potassium, and low in sodium, added sugar, and fat. An example eating plan is called the DASH (Dietary Approaches to Stop Hypertension) diet. To eat this way: ? Eat plenty of fresh fruits and vegetables. Try to fill half of your plate at each meal with fruits and vegetables. ? Eat whole grains, such as whole wheat pasta, brown rice, or whole grain bread. Fill about one quarter of your plate with whole grains. ? Eat or drink low-fat dairy products, such as skim milk or low-fat yogurt. ? Avoid fatty cuts of meat, processed or cured meats, and poultry with skin. Fill about one quarter of your plate with lean proteins, such as fish, chicken without skin, beans, eggs, and tofu. ? Avoid premade and processed foods. These tend to be higher in sodium, added sugar, and fat.  Reduce your daily sodium intake. Most people with hypertension should eat less than 1,500 mg of sodium a day.  Limit alcohol intake to no more than 1 drink a day for nonpregnant women and 2 drinks a day for men. One drink equals 12 oz of beer, 5 oz of wine, or 1 oz of hard liquor. Lifestyle  Work with your health care provider to maintain a healthy body weight or to lose weight. Ask what an ideal weight is for you.  Get at least 30 minutes of exercise that causes your heart to beat faster (aerobic exercise) most days of the week. Activities may include walking, swimming, or biking.  Include exercise to strengthen your muscles (resistance exercise), such as pilates or lifting weights, as part of your  weekly exercise routine. Try to do these types of exercises for 30 minutes at least 3 days a week.  Do not use any products that contain nicotine or tobacco, such as cigarettes and e-cigarettes. If you need help quitting, ask your health care provider.  Monitor your blood pressure at home as told by your health care provider.  Keep all follow-up visits as told by your health care provider. This is important. Medicines  Take over-the-counter and prescription medicines only as told by your health care provider. Follow directions carefully. Blood pressure medicines must be taken as prescribed.  Do not skip doses of blood pressure medicine. Doing this puts you at risk for problems and can make the medicine less effective.  Ask your health care provider about side effects or reactions to medicines that you should watch for. Contact a health care provider if:  You think you are having a reaction to a medicine you are taking.  You have headaches that keep coming back (recurring).  You feel dizzy.  You have  swelling in your ankles.  You have trouble with your vision. Get help right away if:  You develop a severe headache or confusion.  You have unusual weakness or numbness.  You feel faint.  You have severe pain in your chest or abdomen.  You vomit repeatedly.  You have trouble breathing. Summary  Hypertension is when the force of blood pumping through your arteries is too strong. If this condition is not controlled, it may put you at risk for serious complications.  Your personal target blood pressure may vary depending on your medical conditions, your age, and other factors. For most people, a normal blood pressure is less than 120/80.  Hypertension is treated with lifestyle changes, medicines, or a combination of both. Lifestyle changes include weight loss, eating a healthy, low-sodium diet, exercising more, and limiting alcohol. This information is not intended to replace  advice given to you by your health care provider. Make sure you discuss any questions you have with your health care provider. Document Released: 10/27/2005 Document Revised: 09/24/2016 Document Reviewed: 09/24/2016 Elsevier Interactive Patient Education  [employer].

## 2018-01-14 ENCOUNTER — Telehealth: Payer: Self-pay | Admitting: Internal Medicine

## 2018-01-14 DIAGNOSIS — I10 Essential (primary) hypertension: Secondary | ICD-10-CM

## 2018-01-14 MED ORDER — AMLODIPINE BESYLATE 5 MG PO TABS: 2.5000 mg | ORAL_TABLET | Freq: Every day | ORAL | 1 refills | Status: DC

## 2018-01-14 NOTE — Addendum Note (Signed)
Addended by: Elpidio Galea T on: 01/14/2018 10:38 AM   Modules accepted: Orders

## 2018-01-14 NOTE — Telephone Encounter (Signed)
Copied from [employer] [id number]. Topic: Quick Communication - Rx Refill/Question >> Jan 14, 2018  9:24 AM Ether Griffins B wrote: Medication: amLODipine (NORVASC) 5 MG tablet           chlorthalidone (HYGROTON) 25 MG tablet  Pt requesting scripts be printed out so she can pick up and find what pharmacy has it cheapest    Agent: Please be advised that RX refills may take up to 3 business days. We ask that you follow-up with your pharmacy.

## 2018-01-14 NOTE — Telephone Encounter (Signed)
medication has been refilled. 

## 2018-01-21 ENCOUNTER — Other Ambulatory Visit: Payer: Self-pay

## 2018-01-21 DIAGNOSIS — I10 Essential (primary) hypertension: Secondary | ICD-10-CM

## 2018-01-21 MED ORDER — AMLODIPINE BESYLATE 5 MG PO TABS: 2.5000 mg | ORAL_TABLET | Freq: Every day | ORAL | 1 refills | Status: DC

## 2018-01-21 MED ORDER — CHLORTHALIDONE 25 MG PO TABS: 25.0000 mg | ORAL_TABLET | Freq: Every day | ORAL | 1 refills | Status: DC

## 2018-03-05 ENCOUNTER — Other Ambulatory Visit: Payer: Self-pay

## 2018-03-05 ENCOUNTER — Encounter: Payer: Self-pay | Admitting: Internal Medicine

## 2018-03-05 ENCOUNTER — Ambulatory Visit (INDEPENDENT_AMBULATORY_CARE_PROVIDER_SITE_OTHER): Payer: Medicare Other | Admitting: Internal Medicine

## 2018-03-05 VITALS — BP 128/76 | HR 76 | Temp 97.9°F | Ht 63.75 in | Wt 156.4 lb

## 2018-03-05 DIAGNOSIS — Z1211 Encounter for screening for malignant neoplasm of colon: Secondary | ICD-10-CM

## 2018-03-05 DIAGNOSIS — E559 Vitamin D deficiency, unspecified: Secondary | ICD-10-CM | POA: Diagnosis not present

## 2018-03-05 DIAGNOSIS — R739 Hyperglycemia, unspecified: Secondary | ICD-10-CM | POA: Diagnosis not present

## 2018-03-05 DIAGNOSIS — E785 Hyperlipidemia, unspecified: Secondary | ICD-10-CM | POA: Diagnosis not present

## 2018-03-05 DIAGNOSIS — E2839 Other primary ovarian failure: Secondary | ICD-10-CM | POA: Diagnosis not present

## 2018-03-05 DIAGNOSIS — I10 Essential (primary) hypertension: Secondary | ICD-10-CM | POA: Diagnosis not present

## 2018-03-05 LAB — BASIC METABOLIC PANEL
BUN: 22 mg/dL (ref 6–23)
CALCIUM: 9.6 mg/dL (ref 8.4–10.5)
CO2: 29 meq/L (ref 19–32)
Chloride: 99 mEq/L (ref 96–112)
Creatinine, Ser: 0.79 mg/dL (ref 0.40–1.20)
GFR: 77.21 mL/min (ref >60.00)
Glucose, Bld: 163 mg/dL — ABNORMAL HIGH (ref 70–99)
POTASSIUM: 3.4 meq/L — AB (ref 3.5–5.1)
SODIUM: 138 meq/L (ref 135–145)

## 2018-03-05 LAB — VITAMIN D 25 HYDROXY (VIT D DEFICIENCY, FRACTURES): VITD: 23.41 ng/mL — AB (ref 30.00–100.00)

## 2018-03-05 NOTE — Patient Instructions (Addendum)
Please do cologaurd on Monday We will sch DEXA Try Cetaphil or Cerave Cream to skin  Try Neutrogena Ultrasheer lotion SPF 30 to face and spray to body  Vitamin D3 at least 1000 IU daily and calcium 600 mg 2x per day Please schedule no food x 12 hours, cholesterol and A1C check 05/2018  Take care  F/u 09/2018   Pneumococcal Conjugate Vaccine suspension for injection What is this medicine? PNEUMOCOCCAL VACCINE (NEU mo KOK al vak SEEN) is a vaccine used to prevent pneumococcus bacterial infections. These bacteria can cause serious infections like pneumonia, meningitis, and blood infections. This vaccine will lower your chance of getting pneumonia. If you do get pneumonia, it can make your symptoms milder and your illness shorter. This vaccine will not treat an infection and will not cause infection. This vaccine is recommended for infants and young children, adults with certain medical conditions, and adults 72 years or older. This medicine may be used for other purposes; ask your health care provider or pharmacist if you have questions. COMMON BRAND NAME(S): Prevnar, Prevnar 13 What should I tell my health care provider before I take this medicine? They need to know if you have any of these conditions: -bleeding problems -fever -immune system problems -an unusual or allergic reaction to pneumococcal vaccine, diphtheria toxoid, other vaccines, latex, other medicines, foods, dyes, or preservatives -pregnant or trying to get pregnant -breast-feeding How should I use this medicine? This vaccine is for injection into a muscle. It is given by a health care professional. A copy of Vaccine Information Statements will be given before each vaccination. Read this sheet carefully each time. The sheet may change frequently. Talk to your pediatrician regarding the use of this medicine in children. While this drug may be prescribed for children as young as 47 weeks old for selected conditions, precautions do  apply. Overdosage: If you think you have taken too much of this medicine contact a poison control center or emergency room at once. NOTE: This medicine is only for you. Do not share this medicine with others. What if I miss a dose? It is important not to miss your dose. Call your doctor or health care professional if you are unable to keep an appointment. What may interact with this medicine? -medicines for cancer chemotherapy -medicines that suppress your immune function -steroid medicines like prednisone or cortisone This list may not describe all possible interactions. Give your health care provider a list of all the medicines, herbs, non-prescription drugs, or dietary supplements you use. Also tell them if you smoke, drink alcohol, or use illegal drugs. Some items may interact with your medicine. What should I watch for while using this medicine? Mild fever and pain should go away in 3 days or less. Report any unusual symptoms to your doctor or health care professional. What side effects may I notice from receiving this medicine? Side effects that you should report to your doctor or health care professional as soon as possible: -allergic reactions like skin rash, itching or hives, swelling of the face, lips, or tongue -breathing problems -confused -fast or irregular heartbeat -fever over 102 degrees F -seizures -unusual bleeding or bruising -unusual muscle weakness Side effects that usually do not require medical attention (report to your doctor or health care professional if they continue or are bothersome): -aches and pains -diarrhea -fever of 102 degrees F or less -headache -irritable -loss of appetite -pain, tender at site where injected -trouble sleeping This list may not describe all possible side  effects. Call your doctor for medical advice about side effects. You may report side effects to FDA at 1-800-FDA-1088. Where should I keep my medicine? This does not apply. This  vaccine is given in a clinic, pharmacy, doctor's office, or other health care setting and will not be stored at home. NOTE: This sheet is a summary. It may not cover all possible information. If you have questions about this medicine, talk to your doctor, pharmacist, or health care provider.  2018 Elsevier/Gold Standard (2014-08-03 10:27:27)    Cholesterol Cholesterol is a white, waxy, fat-like substance that is needed by the human body in small amounts. The liver makes all the cholesterol we need. Cholesterol is carried from the liver by the blood through the blood vessels. Deposits of cholesterol (plaques) may build up on blood vessel (artery) walls. Plaques make the arteries narrower and stiffer. Cholesterol plaques increase the risk for heart attack and stroke. You cannot feel your cholesterol level even if it is very high. The only way to know that it is high is to have a blood test. Once you know your cholesterol levels, you should keep a record of the test results. Work with your health care provider to keep your levels in the desired range. What do the results mean?  Total cholesterol is a rough measure of all the cholesterol in your blood.  LDL (low-density lipoprotein) is the "bad" cholesterol. This is the type that causes plaque to build up on the artery walls. You want this level to be low.  HDL (high-density lipoprotein) is the "good" cholesterol because it cleans the arteries and carries the LDL away. You want this level to be high.  Triglycerides are fat that the body can either burn for energy or store. High levels are closely linked to heart disease. What are the desired levels of cholesterol?  Total cholesterol below 200.  LDL below 100 for people who are at risk, below 70 for people at very high risk.  HDL above 40 is good. A level of 60 or higher is considered to be protective against heart disease.  Triglycerides below 150. How can I lower my  cholesterol? Diet Follow your diet program as told by your health care provider.  Choose fish or white meat chicken and [place], roasted or baked. Limit fatty cuts of red meat, fried foods, and processed meats, such as sausage and lunch meats.  Eat lots of fresh fruits and vegetables.  Choose whole grains, beans, pasta, potatoes, and cereals.  Choose olive oil, corn oil, or canola oil, and use only small amounts.  Avoid butter, mayonnaise, shortening, or palm kernel oils.  Avoid foods with trans fats.  Drink skim or nonfat milk and eat low-fat or nonfat yogurt and cheeses. Avoid whole milk, cream, ice cream, egg yolks, and full-fat cheeses.  Healthier desserts include angel food cake, ginger snaps, animal crackers, hard candy, popsicles, and low-fat or nonfat frozen yogurt. Avoid pastries, cakes, pies, and cookies.  Exercise  Follow your exercise program as told by your health care provider. A regular program: ? Helps to decrease LDL and raise HDL. ? Helps with weight control.  Do things that increase your activity level, such as gardening, walking, and taking the stairs.  Ask your health care provider about ways that you can be more active in your daily life.  Medicine  Take over-the-counter and prescription medicines only as told by your health care provider. ? Medicine may be prescribed by your health care provider to help  lower cholesterol and decrease the risk for heart disease. This is usually done if diet and exercise have failed to bring down cholesterol levels. ? If you have several risk factors, you may need medicine even if your levels are normal.  This information is not intended to replace advice given to you by your health care provider. Make sure you discuss any questions you have with your health care provider. Document Released: 07/22/2001 Document Revised: 05/24/2016 Document Reviewed: 04/26/2016 Elsevier Interactive Patient Education  [employer].

## 2018-03-05 NOTE — Progress Notes (Signed)
Chief Complaint  Patient presents with  . Follow-up   F/u  1. HTN controlled on Norvasc 2.5 mg qd, chorthalidone 25 mg qd controlled  2. HLD will get repeat lipid 05/2018    Review of Systems  Constitutional: Negative for weight loss.  HENT: Negative for hearing loss.   Eyes: Negative for blurred vision.  Respiratory: Negative for shortness of breath.   Cardiovascular: Negative for chest pain.  Gastrointestinal: Negative for abdominal pain.  Musculoskeletal: Negative for falls.  Skin: Negative for rash.  Neurological: Negative for headaches.  Psychiatric/Behavioral: Negative for depression.   Past Medical History:  Diagnosis Date  . Chicken pox   . Hypertension   . Wears glasses    Past Surgical History:  Procedure Laterality Date  . CESAREAN SECTION     x 1    Family History  Problem Relation Age of Onset  . Stroke Mother   . Hypertension Mother   . Diabetes Mother   . Cancer Father        prostate   . Hyperlipidemia Father   . Diabetes Father   . Hyperlipidemia Other        grandparents   . Breast cancer Neg Hx    Social History   Socioeconomic History  . Marital status: Married    Spouse name: Not on file  . Number of children: Not on file  . Years of education: Not on file  . Highest education level: Not on file  Occupational History  . Occupation: retired  Scientific laboratory technician  . Financial resource strain: Not on file  . Food insecurity:    Worry: Not on file    Inability: Not on file  . Transportation needs:    Medical: Not on file    Non-medical: Not on file  Tobacco Use  . Smoking status: Never Smoker  . Smokeless tobacco: Never Used  Substance and Sexual Activity  . Alcohol use: Yes    Comment: occasional  . Drug use: No  . Sexual activity: Not on file  Lifestyle  . Physical activity:    Days per week: Not on file    Minutes per session: Not on file  . Stress: Not on file  Relationships  . Social connections:    Talks on phone: Not on file     Gets together: Not on file    Attends religious service: Not on file    Active member of club or organization: Not on file    Attends meetings of clubs or organizations: Not on file    Relationship status: Not on file  . Intimate partner violence:    Fear of current or ex partner: Not on file    Emotionally abused: Not on file    Physically abused: Not on file    Forced sexual activity: Not on file  Other Topics Concern  . Not on file  Social History Narrative   Retired (previous jobs Science writer, private investigation fraud, [employer]   Lived in [place] for a while with husband who is from there   Lived all over [place]. From [place]    1 son lives in [place] now but lived [place] prior with 1 grandson in [place] and 1 grandson in [place]. Wine denies cig/drugs    Likes to exercise    Sexually active with current husband since age 75 y.o    Family origin from [place]  .  amLODipine (NORVASC) 5 MG tablet Take 0.5 tablets (2.5 mg total) by mouth daily.  . chlorthalidone (HYGROTON) 25 MG tablet Take 1 tablet (25 mg total) by mouth daily. In the morning  . magnesium 30 MG tablet Take 30 mg daily by mouth.  . vitamin B-12 (CYANOCOBALAMIN) 100 MCG tablet Take 100 mcg daily by mouth.   Allergies  Allergen Reactions  . Lisinopril     Cough    No results found for this or any previous visit (from the past 2160 hour(s)). Objective  Body mass index is 27.06 kg/m. Wt Readings from Last 3 Encounters:  03/05/18 156 lb 6.4 oz (70.9 kg)  11/27/17 153 lb 8 oz (69.6 kg)  10/22/17 157 lb 6 oz (71.4 kg)   Temp Readings from Last 3 Encounters:  03/05/18 97.9 F (36.6 C) (Oral)  11/27/17 98.8 F (37.1 C) (Oral)  10/22/17 98.4 F (36.9 C) (Oral)   BP Readings from Last 3 Encounters:  03/05/18 128/76  11/27/17 124/68  10/22/17 (!) 160/86   Pulse Readings from Last 3 Encounters:  03/05/18 76  11/27/17 91  10/22/17 75    Physical Exam   Constitutional: She is oriented to person, place, and time. Vital signs are normal. She appears well-developed and well-nourished. She is cooperative.  HENT:  Head: Normocephalic and atraumatic.  Mouth/Throat: Oropharynx is clear and moist and mucous membranes are normal.  Eyes: Pupils are equal, round, and reactive to light. Conjunctivae are normal.  Cardiovascular: Normal rate, regular rhythm and normal heart sounds.  Pulmonary/Chest: Effort normal and breath sounds normal.  Neurological: She is alert and oriented to person, place, and time. Gait normal.  Skin: Skin is warm, dry and intact.  Psychiatric: She has a normal mood and affect. Her speech is normal and behavior is normal. Judgment and thought content normal. Cognition and memory are normal.  Nursing note and vitals reviewed.   Assessment   1. HTN  2. HLD  3. HM Plan  1.  norvasc 2.5 qd, chlorthalidone 25 mg qd  Check BMET today  2. Check lipid 05/2018  3.  Declines vaccines (shingrix, prevnar, flu). Tdap due 2020  Referred cologaurd  mammo UTD Pap UTD Referral dexa  Provider: Dr. Olivia Mackie McLean-Scocuzza-Internal Medicine

## 2018-03-19 ENCOUNTER — Ambulatory Visit: Payer: PRIVATE HEALTH INSURANCE

## 2018-04-18 DIAGNOSIS — Z1211 Encounter for screening for malignant neoplasm of colon: Secondary | ICD-10-CM | POA: Diagnosis not present

## 2018-04-20 LAB — COLOGUARD: COLOGUARD: NEGATIVE

## 2018-05-03 ENCOUNTER — Telehealth: Payer: Self-pay | Admitting: Internal Medicine

## 2018-05-03 NOTE — Telephone Encounter (Signed)
cologuard negative 04/20/18 We will repeat in 3 years   Take care   [place]

## 2018-05-05 ENCOUNTER — Ambulatory Visit
Admission: RE | Admit: 2018-05-05 | Discharge: 2018-05-05 | Disposition: A | Discharge status: Home / Self Care | Payer: Medicare Other | Source: Ambulatory Visit | Attending: Internal Medicine | Admitting: Internal Medicine

## 2018-05-05 DIAGNOSIS — E2839 Other primary ovarian failure: Secondary | ICD-10-CM | POA: Diagnosis not present

## 2018-05-05 DIAGNOSIS — M8589 Other specified disorders of bone density and structure, multiple sites: Secondary | ICD-10-CM | POA: Diagnosis not present

## 2018-05-05 DIAGNOSIS — Z780 Asymptomatic menopausal state: Secondary | ICD-10-CM | POA: Diagnosis not present

## 2018-05-05 NOTE — Telephone Encounter (Signed)
Patient notified of Cologuard and verbalized understanding

## 2018-05-18 ENCOUNTER — Other Ambulatory Visit (INDEPENDENT_AMBULATORY_CARE_PROVIDER_SITE_OTHER): Payer: Medicare Other

## 2018-05-18 DIAGNOSIS — R739 Hyperglycemia, unspecified: Secondary | ICD-10-CM

## 2018-05-18 DIAGNOSIS — E785 Hyperlipidemia, unspecified: Secondary | ICD-10-CM | POA: Diagnosis not present

## 2018-05-18 LAB — LIPID PANEL
CHOLESTEROL: 215 mg/dL — AB (ref 0–200)
HDL: 54.9 mg/dL (ref >39.00)
LDL CALC: 122 mg/dL — AB (ref 0–99)
NonHDL: 159.75
TRIGLYCERIDES: 188 mg/dL — AB (ref 0.0–149.0)
Total CHOL/HDL Ratio: 4
VLDL: 37.6 mg/dL (ref 0.0–40.0)

## 2018-05-18 LAB — HEMOGLOBIN A1C: HEMOGLOBIN A1C: 5.9 % (ref 4.6–6.5)

## 2018-05-19 ENCOUNTER — Other Ambulatory Visit: Payer: PRIVATE HEALTH INSURANCE

## 2018-05-21 ENCOUNTER — Encounter: Payer: Self-pay | Admitting: *Deleted

## 2018-07-15 ENCOUNTER — Other Ambulatory Visit: Payer: Self-pay | Admitting: Internal Medicine

## 2018-07-15 DIAGNOSIS — I10 Essential (primary) hypertension: Secondary | ICD-10-CM

## 2018-07-15 MED ORDER — AMLODIPINE BESYLATE 5 MG PO TABS: 2.5000 mg | ORAL_TABLET | Freq: Every day | ORAL | 1 refills | Status: DC

## 2018-07-15 MED ORDER — CHLORTHALIDONE 25 MG PO TABS: 25.0000 mg | ORAL_TABLET | Freq: Every day | ORAL | 1 refills | Status: DC

## 2018-08-02 DIAGNOSIS — H4010X0 Unspecified open-angle glaucoma, stage unspecified: Secondary | ICD-10-CM | POA: Diagnosis not present

## 2018-08-02 DIAGNOSIS — H401132 Primary open-angle glaucoma, bilateral, moderate stage: Secondary | ICD-10-CM | POA: Diagnosis not present

## 2018-08-10 DIAGNOSIS — H4010X0 Unspecified open-angle glaucoma, stage unspecified: Secondary | ICD-10-CM | POA: Diagnosis not present

## 2018-09-27 ENCOUNTER — Other Ambulatory Visit: Payer: Self-pay | Admitting: Internal Medicine

## 2018-09-27 DIAGNOSIS — Z1231 Encounter for screening mammogram for malignant neoplasm of breast: Secondary | ICD-10-CM

## 2018-10-05 ENCOUNTER — Ambulatory Visit: Payer: PRIVATE HEALTH INSURANCE | Admitting: Internal Medicine

## 2018-10-28 ENCOUNTER — Ambulatory Visit
Admission: RE | Admit: 2018-10-28 | Discharge: 2018-10-28 | Disposition: A | Discharge status: Home / Self Care | Payer: Medicare Other | Source: Ambulatory Visit | Attending: Internal Medicine | Admitting: Internal Medicine

## 2018-10-28 DIAGNOSIS — Z1231 Encounter for screening mammogram for malignant neoplasm of breast: Secondary | ICD-10-CM

## 2018-11-04 ENCOUNTER — Ambulatory Visit (INDEPENDENT_AMBULATORY_CARE_PROVIDER_SITE_OTHER): Payer: Medicare Other | Admitting: Internal Medicine

## 2018-11-04 ENCOUNTER — Encounter: Payer: Self-pay | Admitting: Internal Medicine

## 2018-11-04 VITALS — BP 138/82 | HR 75 | Temp 98.1°F | Ht 64.0 in | Wt 158.9 lb

## 2018-11-04 DIAGNOSIS — Z1389 Encounter for screening for other disorder: Secondary | ICD-10-CM | POA: Diagnosis not present

## 2018-11-04 DIAGNOSIS — E785 Hyperlipidemia, unspecified: Secondary | ICD-10-CM | POA: Diagnosis not present

## 2018-11-04 DIAGNOSIS — R7303 Prediabetes: Secondary | ICD-10-CM | POA: Diagnosis not present

## 2018-11-04 DIAGNOSIS — M8580 Other specified disorders of bone density and structure, unspecified site: Secondary | ICD-10-CM | POA: Diagnosis not present

## 2018-11-04 DIAGNOSIS — H409 Unspecified glaucoma: Secondary | ICD-10-CM | POA: Diagnosis not present

## 2018-11-04 DIAGNOSIS — Z13818 Encounter for screening for other digestive system disorders: Secondary | ICD-10-CM

## 2018-11-04 DIAGNOSIS — Z1329 Encounter for screening for other suspected endocrine disorder: Secondary | ICD-10-CM | POA: Diagnosis not present

## 2018-11-04 DIAGNOSIS — Z1159 Encounter for screening for other viral diseases: Secondary | ICD-10-CM

## 2018-11-04 DIAGNOSIS — I10 Essential (primary) hypertension: Secondary | ICD-10-CM

## 2018-11-04 DIAGNOSIS — Z0184 Encounter for antibody response examination: Secondary | ICD-10-CM | POA: Diagnosis not present

## 2018-11-04 DIAGNOSIS — E559 Vitamin D deficiency, unspecified: Secondary | ICD-10-CM | POA: Diagnosis not present

## 2018-11-04 MED ORDER — CHLORTHALIDONE 25 MG PO TABS: 12.5000 mg | ORAL_TABLET | Freq: Every day | ORAL | 3 refills | Status: DC

## 2018-11-04 MED ORDER — AMLODIPINE BESYLATE 5 MG PO TABS: 5.0000 mg | ORAL_TABLET | Freq: Every day | ORAL | 3 refills | Status: DC

## 2018-11-04 NOTE — Progress Notes (Signed)
Chief Complaint  Patient presents with  . Follow-up   F/u no complaints today  1. HTN/HLD taking norvasc 2.5 and chlorthalidone 12.5 mg qd BP 138/82 not at new goal today <130/<80 2. Glaucoma, left on new eye drops due to f/u upcoming w/in the next month    Review of Systems  Constitutional: Negative for weight loss.  HENT: Negative for hearing loss.   Eyes: Negative for blurred vision.  Respiratory: Negative for shortness of breath.   Cardiovascular: Negative for chest pain.  Gastrointestinal: Negative for abdominal pain.  Musculoskeletal: Negative for falls.  Skin: Negative for rash.  Neurological: Negative for headaches.  Psychiatric/Behavioral: Negative for depression.   Past Medical History:  Diagnosis Date  . Chicken pox   . Hypertension   . Wears glasses    Past Surgical History:  Procedure Laterality Date  . CESAREAN SECTION     x 1    Family History  Problem Relation Age of Onset  . Stroke Mother   . Hypertension Mother   . Diabetes Mother   . Cancer Father        prostate   . Hyperlipidemia Father   . Diabetes Father   . Hyperlipidemia Other        grandparents   . Breast cancer Neg Hx    Social History   Socioeconomic History  . Marital status: Married    Spouse name: Not on file  . Number of children: Not on file  . Years of education: Not on file  . Highest education level: Not on file  Occupational History  . Occupation: retired  Scientific laboratory technician  . Financial resource strain: Not on file  . Food insecurity:    Worry: Not on file    Inability: Not on file  . Transportation needs:    Medical: Not on file    Non-medical: Not on file  Tobacco Use  . Smoking status: Never Smoker  . Smokeless tobacco: Never Used  Substance and Sexual Activity  . Alcohol use: Yes    Comment: occasional  . Drug use: No  . Sexual activity: Not on file  Lifestyle  . Physical activity:    Days per week: Not on file    Minutes per session: Not on file  .  Stress: Not on file  Relationships  . Social connections:    Talks on phone: Not on file    Gets together: Not on file    Attends religious service: Not on file    Active member of club or organization: Not on file    Attends meetings of clubs or organizations: Not on file    Relationship status: Not on file  . Intimate partner violence:    Fear of current or ex partner: Not on file    Emotionally abused: Not on file    Physically abused: Not on file    Forced sexual activity: Not on file  Other Topics Concern  . Not on file  Social History Narrative   Retired (previous jobs Science writer, private investigation fraud, [employer]   Lived in [place] for a while with husband who is from there   Lived all over [place]. From [place]    1 son lives in [place] now but lived [place] prior with 1 grandson in [place] and 1 grandson in [place]. Wine denies cig/drugs    Likes to exercise    Sexually active with current husband since age 39 y.o  Family origin from [place]    Current Meds  Medication Sig  . amLODipine (NORVASC) 5 MG tablet Take 0.5 tablets (2.5 mg total) by mouth daily.  . chlorthalidone (HYGROTON) 25 MG tablet Take 1 tablet (25 mg total) by mouth daily. In the morning  . dorzolamide-timolol (COSOPT) 22.3-6.8 MG/ML ophthalmic solution Place 1 drop into the left eye 2 (two) times daily.  Marland Kitchen LATANOPROST OP Apply 1 drop to eye 1 day or 1 dose.  . magnesium 30 MG tablet Take 30 mg daily by mouth.  . vitamin B-12 (CYANOCOBALAMIN) 100 MCG tablet Take 100 mcg daily by mouth.   Allergies  Allergen Reactions  . Lisinopril     Cough    No results found for this or any previous visit (from the past 2160 hour(s)). Objective  Body mass index is 27.28 kg/m. Wt Readings from Last 3 Encounters:  11/04/18 158 lb 14.4 oz (72.1 kg)  03/05/18 156 lb 6.4 oz (70.9 kg)  11/27/17 153 lb 8 oz (69.6 kg)   Temp Readings from Last 3 Encounters:  11/04/18 98.1 F (36.7 C) (Oral)   03/05/18 97.9 F (36.6 C) (Oral)  11/27/17 98.8 F (37.1 C) (Oral)   BP Readings from Last 3 Encounters:  11/04/18 138/82  03/05/18 128/76  11/27/17 124/68   Pulse Readings from Last 3 Encounters:  11/04/18 75  03/05/18 76  11/27/17 91    Physical Exam Vitals signs and nursing note reviewed.  Constitutional:      Appearance: Normal appearance. She is well-developed.  HENT:     Head: Normocephalic and atraumatic.     Nose: Nose normal.     Mouth/Throat:     Mouth: Mucous membranes are moist.     Pharynx: Oropharynx is clear.  Eyes:     Conjunctiva/sclera: Conjunctivae normal.     Pupils: Pupils are equal, round, and reactive to light.  Cardiovascular:     Rate and Rhythm: Normal rate and regular rhythm.     Heart sounds: Normal heart sounds.  Pulmonary:     Effort: Pulmonary effort is normal.     Breath sounds: Normal breath sounds.  Skin:    General: Skin is warm.  Neurological:     General: No focal deficit present.     Mental Status: She is alert and oriented to person, place, and time. Mental status is at baseline.     Gait: Gait normal.  Psychiatric:        Attention and Perception: Attention and perception normal.        Mood and Affect: Mood and affect normal.        Speech: Speech normal.        Behavior: Behavior normal. Behavior is cooperative.        Thought Content: Thought content normal.        Cognition and Memory: Cognition and memory normal.        Judgment: Judgment normal.     Assessment   1. HTN/HLD  2. Glaucoma, left  3. HM Plan   1. norvasc 5 mg change from 2.5 mg qd, chlorthalidone keep dose 12.5 mg qd  rec healthy diet choices and exercise  2. F/u [facility]  Cont drops  3.  Declines vaccines (shingrix, prevnar, flu).  -Tdap due 2020 given Rx today   04/20/18 cologaurd  mammo UTD neg 10/28/18 normal  Pap UTD 10/22/17 negative pap neg HPV dexa 05/05/18 osteopenia rec calcium and vit D5000 IU D3 qd  sch fasting  labs  12/2018  Provider: Dr. Olivia Mackie McLean-Scocuzza-Internal Medicine

## 2018-11-04 NOTE — Patient Instructions (Addendum)
Vitamin D 5000 IU D3 daily  Calcium 600 mg 1-2 x per day   Tdap Vaccine (Tetanus, Diphtheria and Pertussis): What You Need to Know 1. Why get vaccinated? Tetanus, diphtheria and pertussis are very serious diseases. Tdap vaccine can protect [place] from these diseases. And, Tdap vaccine given to pregnant women can protect newborn babies against pertussis.Marland Kitchen TETANUS (Lockjaw) is rare in the [place] today. It causes painful muscle tightening and stiffness, usually all over the body.  It can lead to tightening of muscles in the head and neck so you can't open your mouth, swallow, or sometimes even breathe. Tetanus kills about 1 out of 10 people who are infected even after receiving the best medical care. DIPHTHERIA is also rare in the [place] today. It can cause a thick coating to form in the back of the throat.  It can lead to breathing problems, heart failure, paralysis, and death. PERTUSSIS (Whooping Cough) causes severe coughing spells, which can cause difficulty breathing, vomiting and disturbed sleep.  It can also lead to weight loss, incontinence, and rib fractures. Up to 2 in 100 adolescents and 5 in 100 adults with pertussis are hospitalized or have complications, which could include pneumonia or death. These diseases are caused by bacteria. Diphtheria and pertussis are spread from person to person through secretions from coughing or sneezing. Tetanus enters the body through cuts, scratches, or wounds. Before vaccines, as many as 200,000 cases of diphtheria, 200,000 cases of pertussis, and hundreds of cases of tetanus, were reported in the [place] each year. Since vaccination began, reports of cases for tetanus and diphtheria have dropped by about 99% and for pertussis by about 80%. 2. Tdap vaccine Tdap vaccine can protect adolescents and adults from tetanus, diphtheria, and pertussis. One dose of Tdap is routinely given at age 59 or 67. People who did not get Tdap at that age  should get it as soon as possible. Tdap is especially important for healthcare professionals and anyone having close contact with a baby younger than 12 months. Pregnant women should get a dose of Tdap during every pregnancy, to protect the newborn from pertussis. Infants are most at risk for severe, life-threatening complications from pertussis. Another vaccine, called Td, protects against tetanus and diphtheria, but not pertussis. A Td booster should be given every 10 years. Tdap may be given as one of these boosters if you have never gotten Tdap before. Tdap may also be given after a severe cut or burn to prevent tetanus infection. Your doctor or the person giving you the vaccine can give you more information. Tdap may safely be given at the same time as other vaccines. 3. Some people should not get this vaccine  A person who has ever had a life-threatening allergic reaction after a previous dose of any diphtheria, tetanus or pertussis containing vaccine, OR has a severe allergy to any part of this vaccine, should not get Tdap vaccine. Tell the person giving the vaccine about any severe allergies.  Anyone who had coma or long repeated seizures within 7 days after a childhood dose of DTP or DTaP, or a previous dose of Tdap, should not get Tdap, unless a cause other than the vaccine was found. They can still get Td.  Talk to your doctor if you: ? have seizures or another nervous system problem, ? had severe pain or swelling after any vaccine containing diphtheria, tetanus or pertussis, ? ever had a condition called Guillain-Barr Syndrome (GBS), ? aren't feeling well  on the day the shot is scheduled. 4. Risks With any medicine, including vaccines, there is a chance of side effects. These are usually mild and go away on their own. Serious reactions are also possible but are rare. Most people who get Tdap vaccine do not have any problems with it. Mild problems following Tdap (Did not interfere  with activities)  Pain where the shot was given (about 3 in 4 adolescents or 2 in 3 adults)  Redness or swelling where the shot was given (about 1 person in 5)  Mild fever of at least 100.23F (up to about 1 in 25 adolescents or 1 in 100 adults)  Headache (about 3 or 4 people in 10)  Tiredness (about 1 person in 3 or 4)  Nausea, vomiting, diarrhea, stomach ache (up to 1 in 4 adolescents or 1 in 10 adults)  Chills, sore joints (about 1 person in 10)  Body aches (about 1 person in 3 or 4)  Rash, swollen glands (uncommon) Moderate problems following Tdap (Interfered with activities, but did not require medical attention)  Pain where the shot was given (up to 1 in 5 or 6)  Redness or swelling where the shot was given (up to about 1 in 16 adolescents or 1 in 12 adults)  Fever over 102F (about 1 in 100 adolescents or 1 in 250 adults)  Headache (about 1 in 7 adolescents or 1 in 10 adults)  Nausea, vomiting, diarrhea, stomach ache (up to 1 or 3 people in 100)  Swelling of the entire arm where the shot was given (up to about 1 in 500). Severe problems following Tdap (Unable to perform usual activities; required medical attention)  Swelling, severe pain, bleeding and redness in the arm where the shot was given (rare). Problems that could happen after any vaccine:  People sometimes faint after a medical procedure, including vaccination. Sitting or lying down for about 15 minutes can help prevent fainting, and injuries caused by a fall. Tell your doctor if you feel dizzy, or have vision changes or ringing in the ears.  Some people get severe pain in the shoulder and have difficulty moving the arm where a shot was given. This happens very rarely.  Any medication can cause a severe allergic reaction. Such reactions from a vaccine are very rare, estimated at fewer than 1 in a million doses, and would happen within a few minutes to a few hours after the vaccination. As with any medicine,  there is a very remote chance of a vaccine causing a serious injury or death. The safety of vaccines is always being monitored. For more information, visit: http://www.aguilar.org/ 5. What if there is a serious problem? What should I look for?  Look for anything that concerns you, such as signs of a severe allergic reaction, very high fever, or unusual behavior. Signs of a severe allergic reaction can include hives, swelling of the face and throat, difficulty breathing, a fast heartbeat, dizziness, and weakness. These would usually start a few minutes to a few hours after the vaccination. What should I do?  If you think it is a severe allergic reaction or other emergency that can't wait, call 9-1-1 or get the person to the nearest hospital. Otherwise, call your doctor.  Afterward, the reaction should be reported to the Vaccine Adverse Event Reporting System (VAERS). Your doctor might file this report, or you can do it yourself through the VAERS web site at www.vaers.SamedayNews.es, or by calling 347-050-1121. VAERS does not give medical  advice. 6. The National Vaccine Injury Compensation Program The [employer] Vaccine Injury Compensation Program (VICP) is a federal program that was created to compensate people who may have been injured by certain vaccines. Persons who believe they may have been injured by a vaccine can learn about the program and about filing a claim by calling 2720090071 or visiting the [facility] website at GoldCloset.com.ee. There is a time limit to file a claim for compensation. 7. How can I learn more?  Ask your doctor. He or she can give you the vaccine package insert or suggest other sources of information.  Call your local or state health department.  Contact the Centers for Disease Control and Prevention (CDC): ? Call 662-382-4678 (1-800-CDC-INFO) or ? Visit CDC's website at http://hunter.com/ Vaccine Information Statement Tdap Vaccine (01/03/2014) This  information is not intended to replace advice given to you by your health care provider. Make sure you discuss any questions you have with your health care provider. Document Released: 04/27/2012 Document Revised: 06/14/2018 Document Reviewed: 06/14/2018 Elsevier Interactive Patient Education  2019 [employer].

## 2018-11-08 DIAGNOSIS — H4010X0 Unspecified open-angle glaucoma, stage unspecified: Secondary | ICD-10-CM | POA: Diagnosis not present

## 2019-06-09 ENCOUNTER — Other Ambulatory Visit: Payer: Self-pay

## 2019-09-07 ENCOUNTER — Other Ambulatory Visit: Payer: Self-pay

## 2019-09-07 ENCOUNTER — Ambulatory Visit (INDEPENDENT_AMBULATORY_CARE_PROVIDER_SITE_OTHER): Payer: Medicare Other

## 2019-09-07 DIAGNOSIS — Z0000 Encounter for general adult medical examination without abnormal findings: Secondary | ICD-10-CM

## 2019-09-07 DIAGNOSIS — Z1231 Encounter for screening mammogram for malignant neoplasm of breast: Secondary | ICD-10-CM

## 2019-09-07 NOTE — Progress Notes (Signed)
Subjective:   Diana Monroe is a 68 y.o. female who presents for an Initial Medicare Annual Wellness Visit.  Review of Systems    No ROS.  Medicare Wellness Virtual Visit.  Visual/audio telehealth visit, UTA vital signs.   See social history for additional risk factors.    Cardiac Risk Factors include: advanced age (>87men, >49 women);hypertension     Objective:    Today's Vitals   There is no height or weight on file to calculate BMI.  Advanced Directives 09/07/2019  Does Patient Have a Medical Advance Directive? No  Would patient like information on creating a medical advance directive? No - Patient declined    Current Medications (verified) Outpatient Encounter Medications as of 09/07/2019  Medication Sig   amLODipine (NORVASC) 5 MG tablet Take 1 tablet (5 mg total) by mouth daily.   chlorthalidone (HYGROTON) 25 MG tablet Take 0.5 tablets (12.5 mg total) by mouth daily. In the morning   dorzolamide-timolol (COSOPT) 22.3-6.8 MG/ML ophthalmic solution Place 1 drop into the left eye 2 (two) times daily.   LATANOPROST OP Apply 1 drop to eye 1 day or 1 dose. B/l eyes 1x   magnesium 30 MG tablet Take 30 mg daily by mouth.   vitamin B-12 (CYANOCOBALAMIN) 100 MCG tablet Take 100 mcg daily by mouth.   No facility-administered encounter medications on file as of 09/07/2019.     Allergies (verified) Lisinopril   History: Past Medical History:  Diagnosis Date   Chicken pox    Glaucoma    left eye [place] eye    Hyperlipidemia    Hypertension    Vitamin D deficiency    Wears glasses    Past Surgical History:  Procedure Laterality Date   CESAREAN SECTION     x 1    Family History  Problem Relation Age of Onset   Stroke Mother    Hypertension Mother    Diabetes Mother    Cancer Father        prostate    Hyperlipidemia Father    Diabetes Father    Hyperlipidemia Other        grandparents    Breast cancer Neg Hx    Social History    Socioeconomic History   Marital status: Married    Spouse name: Not on file   Number of children: Not on file   Years of education: Not on file   Highest education level: Not on file  Occupational History   Occupation: retired  Scientist, product/process development strain: Not hard at all   Food insecurity    Worry: Never true    Inability: Never true   Transportation needs    Medical: No    Non-medical: No  Tobacco Use   Smoking status: Never Smoker   Smokeless tobacco: Never Used  Substance and Sexual Activity   Alcohol use: Yes    Comment: occasional   Drug use: No   Sexual activity: Not on file  Lifestyle   Physical activity    Days per week: Not on file    Minutes per session: Not on file   Stress: Not at all  Relationships   Social connections    Talks on phone: Not on file    Gets together: Not on file    Attends religious service: Not on file    Active member of club or organization: Not on file    Attends meetings of clubs or organizations: Not on file  Relationship status: Not on file  Other Topics Concern   Not on file  Social History Narrative   Retired (previous jobs Science writer, private investigation fraud, [employer]   Lived in [place] for a while with husband who is from there   Lived all over [place]. From [place]    1 son lives in [place] now but lived [place] prior with 1 grandson in [place] and 1 grandson in [place]. Wine denies cig/drugs    Likes to exercise    Sexually active with current husband since age 89 y.o    Family origin from [place] given: Not Answered   Clinical Intake:  Pre-visit preparation completed: Yes        Diabetes: No  How often do you need to have someone help you when you read instructions, pamphlets, or other written materials from your doctor or pharmacy?: 1 - Never  Interpreter Needed?: No      Activities of Daily Living In your present state  of health, do you have any difficulty performing the following activities: 09/07/2019  Hearing? N  Vision? N  Difficulty concentrating or making decisions? N  Walking or climbing stairs? N  Dressing or bathing? N  Doing errands, shopping? N  Preparing Food and eating ? N  Using the Toilet? N  In the past six months, have you accidently leaked urine? N  Do you have problems with loss of bowel control? N  Managing your Medications? N  Managing your Finances? N  Housekeeping or managing your Housekeeping? N  Some recent data might be hidden     Immunizations and Health Maintenance  There is no immunization history on file for this patient. Health Maintenance Due  Topic Date Due   Hepatitis C Screening  1951/08/07   COLONOSCOPY  05/25/2001    Patient Care Team: McLean-Scocuzza, Nino Glow, MD as PCP - General (Internal Medicine)  Indicate any recent Medical Services you may have received from other than [facility] providers in the past year (date may be approximate).     Assessment:   This is a routine wellness examination for Kind[place].  Nurse connected with patient 09/07/19 at 10:00 AM EDT by a telephone enabled telemedicine application and verified that I am speaking with the correct person using two identifiers. Patient stated full name and DOB. Patient gave permission to continue with virtual visit. Patient's location was at home and Nurse's location was at [place] office.   Health Maintenance Due: -Mammogram- ordered -Fasting labs scheduled  Update all pending maintenance due as appropriate.   See completed HM at the end of note.   Eye: Visual acuity not assessed. Virtual visit. Wears corrective lenses. Followed by their ophthalmologist.  Dental: UTD  Hearing: Demonstrates normal hearing during visit.  Safety:  Patient feels safe at home- yes Patient does have smoke detectors at home- yes Patient does wear sunscreen or protective clothing when in direct sunlight -  yes Patient does wear seat belt when in a moving vehicle - yes Patient drives- yes Adequate lighting in walkways free from debris- yes Grab bars and handrails used as appropriate- yes Ambulates with no assistive device Cell phone on person when ambulating outside of the home- yes  Social: Alcohol intake - yes      Smoking history- never   Smokers in home? none Illicit drug use? none  Depression: PHQ 2 &9 complete. See screening below. Denies irritability, anhedonia, sadness/tearfullness.  Falls: See screening below.    Medication: Taking as directed and without issues.   Covid-19: Precautions and sickness symptoms discussed. Wears mask, social distancing, hand hygiene as appropriate.   Activities of Daily Living Patient denies needing assistance with: household chores, feeding themselves, getting from bed to chair, getting to the toilet, bathing/showering, dressing, managing money, or preparing meals.   Memory: Patient is alert. Patient denies difficulty focusing or concentrating. Correctly identified the president of the [place], season and recall.  Patient is home schooling grandchild for brain stimulation.  BMI- discussed the importance of a healthy diet, water intake and the benefits of aerobic exercise.  Educational material provided.  Physical activity- walking, no routine  Diet:  Regular Water: good intake  Other Providers Patient Care Team: McLean-Scocuzza, Nino Glow, MD as PCP - General (Internal Medicine)  Hearing/Vision screen  Hearing Screening   125Hz  250Hz  500Hz  1000Hz  2000Hz  3000Hz  4000Hz  6000Hz  8000Hz   Right ear:           Left ear:           Comments: Patient is able to hear conversational tones without difficulty.  No issues reported.  Vision Screening Comments: Wears corrective lenses Visual acuity not assessed, virtual visit.       Dietary issues and exercise activities discussed: Current Exercise Habits: Home exercise routine, Type of  exercise: walking, Intensity: Mild  Goals     Follow up with Primary Care Provider     As scheduled and needed       Depression Screen PHQ 2/9 Scores 09/07/2019  PHQ - 2 Score 0    Fall Risk Fall Risk  09/07/2019 09/28/2017  Falls in the past year? 0 No    Timed Get Up and Go Performed no, virtual visit  Cognitive Function:     6CIT Screen 09/07/2019  What Year? 0 points  What month? 0 points  What time? 0 points  Count back from 20 0 points  Months in reverse 0 points  Repeat phrase 0 points  Total Score 0    Screening Tests Health Maintenance  Topic Date Due   Hepatitis C Screening  [birth year]   COLONOSCOPY  05/25/2001   INFLUENZA VACCINE  02/08/2020 (Originally 06/11/2019)   TETANUS/TDAP  09/06/2020 (Originally 05/25/1970)   PNA vac Low Risk Adult (1 of 2 - PCV13) 09/06/2020 (Originally 05/25/2016)   MAMMOGRAM  10/28/2020   DEXA SCAN  Completed      Plan:   Keep all routine maintenance appointments.   -Mammogram ordered. Call to schedule. [phone].  -Fasting lab appointment scheduled 09/16/19 @ 10:00. Labs previously ordered by pcp.   -Follow up with your doctor 09/29/19 @ 11:00.   Advanced directive mailed to patient per request.   Medicare Attestation I have personally reviewed: The patient's medical and social history Their use of alcohol, tobacco or illicit drugs Their current medications and supplements The patient's functional ability including ADLs,fall risks, home safety risks, cognitive, and hearing and visual impairment Diet and physical activities Evidence for depression   In addition, I have reviewed and discussed with patient certain preventive protocols, quality metrics, and best practice recommendations. A written personalized care plan for preventive services as well as general preventive health recommendations were provided to patient via mail.     Varney Biles, LPN   [id number]

## 2019-09-07 NOTE — Patient Instructions (Addendum)
  Diana Monroe , Thank you for taking time to come for your Medicare Wellness Visit. I appreciate your ongoing commitment to your health goals. Please review the following plan we discussed and let me know if I can assist you in the future.   These are the goals we discussed: Goals    . Follow up with Primary Care Provider     As scheduled and needed        This is a list of the screening recommended for you and due dates:  Health Maintenance  Topic Date Due  .  Hepatitis C: One time screening is recommended by Center for Disease Control  (CDC) for  adults born from [birth year] through 1965.   1951-10-08  . Colon Cancer Screening  05/25/2001  . Flu Shot  02/08/2020*  . Tetanus Vaccine  09/06/2020*  . Pneumonia vaccines (1 of 2 - PCV13) 09/06/2020*  . Mammogram  10/28/2020  . DEXA scan (bone density measurement)  Completed  *Topic was postponed. The date shown is not the original due date.

## 2019-09-09 ENCOUNTER — Other Ambulatory Visit: Payer: Self-pay

## 2019-09-16 ENCOUNTER — Other Ambulatory Visit: Payer: Self-pay

## 2019-09-16 ENCOUNTER — Other Ambulatory Visit (INDEPENDENT_AMBULATORY_CARE_PROVIDER_SITE_OTHER): Payer: Medicare Other

## 2019-09-16 DIAGNOSIS — Z1329 Encounter for screening for other suspected endocrine disorder: Secondary | ICD-10-CM | POA: Diagnosis not present

## 2019-09-16 DIAGNOSIS — I10 Essential (primary) hypertension: Secondary | ICD-10-CM

## 2019-09-16 DIAGNOSIS — R7303 Prediabetes: Secondary | ICD-10-CM

## 2019-09-16 DIAGNOSIS — E559 Vitamin D deficiency, unspecified: Secondary | ICD-10-CM | POA: Diagnosis not present

## 2019-09-16 DIAGNOSIS — Z13818 Encounter for screening for other digestive system disorders: Secondary | ICD-10-CM | POA: Diagnosis not present

## 2019-09-16 DIAGNOSIS — Z0184 Encounter for antibody response examination: Secondary | ICD-10-CM

## 2019-09-16 DIAGNOSIS — Z1389 Encounter for screening for other disorder: Secondary | ICD-10-CM

## 2019-09-16 DIAGNOSIS — Z1159 Encounter for screening for other viral diseases: Secondary | ICD-10-CM

## 2019-09-16 LAB — LIPID PANEL
Cholesterol: 227 mg/dL — ABNORMAL HIGH (ref 0–200)
HDL: 60.5 mg/dL (ref >39.00)
LDL Cholesterol: 143 mg/dL — ABNORMAL HIGH (ref 0–99)
NonHDL: 166.8
Total CHOL/HDL Ratio: 4
Triglycerides: 120 mg/dL (ref 0.0–149.0)
VLDL: 24 mg/dL (ref 0.0–40.0)

## 2019-09-16 LAB — URINALYSIS, ROUTINE W REFLEX MICROSCOPIC
Bacteria, UA: NONE SEEN /HPF
Bilirubin Urine: NEGATIVE
Glucose, UA: NEGATIVE
Hgb urine dipstick: NEGATIVE
Hyaline Cast: NONE SEEN /LPF
Ketones, ur: NEGATIVE
Nitrite: NEGATIVE
Protein, ur: NEGATIVE
RBC / HPF: NONE SEEN /HPF (ref 0–2)
Specific Gravity, Urine: 1.005 (ref 1.001–1.03)
pH: 7 (ref 5.0–8.0)

## 2019-09-16 LAB — COMPREHENSIVE METABOLIC PANEL
ALT: 21 U/L (ref 0–35)
AST: 17 U/L (ref 0–37)
Albumin: 4.6 g/dL (ref 3.5–5.2)
Alkaline Phosphatase: 71 U/L (ref 39–117)
BUN: 16 mg/dL (ref 6–23)
CO2: 31 mEq/L (ref 19–32)
Calcium: 10.1 mg/dL (ref 8.4–10.5)
Chloride: 97 mEq/L (ref 96–112)
Creatinine, Ser: 0.82 mg/dL (ref 0.40–1.20)
GFR: 69.26 mL/min (ref >60.00)
Glucose, Bld: 111 mg/dL — ABNORMAL HIGH (ref 70–99)
Potassium: 3.9 mEq/L (ref 3.5–5.1)
Sodium: 139 mEq/L (ref 135–145)
Total Bilirubin: 0.7 mg/dL (ref 0.2–1.2)
Total Protein: 7.6 g/dL (ref 6.0–8.3)

## 2019-09-16 LAB — CBC WITH DIFFERENTIAL/PLATELET
Basophils Absolute: 0.1 10*3/uL (ref 0.0–0.1)
Basophils Relative: 1 % (ref 0.0–3.0)
Eosinophils Absolute: 0.1 10*3/uL (ref 0.0–0.7)
Eosinophils Relative: 1.5 % (ref 0.0–5.0)
HCT: 43.6 % (ref 36.0–46.0)
Hemoglobin: 14.7 g/dL (ref 12.0–15.0)
Lymphocytes Relative: 28.8 % (ref 12.0–46.0)
Lymphs Abs: 1.8 10*3/uL (ref 0.7–4.0)
MCHC: 33.9 g/dL (ref 30.0–36.0)
MCV: 86.1 fl (ref 78.0–100.0)
Monocytes Absolute: 0.5 10*3/uL (ref 0.1–1.0)
Monocytes Relative: 8.5 % (ref 3.0–12.0)
Neutro Abs: 3.8 10*3/uL (ref 1.4–7.7)
Neutrophils Relative %: 60.2 % (ref 43.0–77.0)
Platelets: 314 10*3/uL (ref 150.0–400.0)
RBC: 5.06 Mil/uL (ref 3.87–5.11)
RDW: 12.7 % (ref 11.5–15.5)
WBC: 6.4 10*3/uL (ref 4.0–10.5)

## 2019-09-16 LAB — VITAMIN D 25 HYDROXY (VIT D DEFICIENCY, FRACTURES): VITD: 31.39 ng/mL (ref 30.00–100.00)

## 2019-09-16 LAB — HEMOGLOBIN A1C: Hgb A1c MFr Bld: 5.8 % (ref 4.6–6.5)

## 2019-09-16 LAB — TSH: TSH: 3.89 u[IU]/mL (ref 0.35–4.50)

## 2019-09-19 LAB — HEPATITIS C ANTIBODY
Hepatitis C Ab: NONREACTIVE
SIGNAL TO CUT-OFF: 0.01 (ref <1.00)

## 2019-09-19 LAB — MEASLES/MUMPS/RUBELLA IMMUNITY
Mumps IgG: 225 AU/mL
Rubella: 22.7 index
Rubeola IgG: >300 AU/mL

## 2019-09-29 ENCOUNTER — Ambulatory Visit (INDEPENDENT_AMBULATORY_CARE_PROVIDER_SITE_OTHER): Payer: Medicare Other | Admitting: Internal Medicine

## 2019-09-29 ENCOUNTER — Encounter: Payer: Self-pay | Admitting: Internal Medicine

## 2019-09-29 ENCOUNTER — Other Ambulatory Visit: Payer: Self-pay

## 2019-09-29 VITALS — BP 125/72 | Ht 64.0 in | Wt 177.0 lb

## 2019-09-29 DIAGNOSIS — E785 Hyperlipidemia, unspecified: Secondary | ICD-10-CM

## 2019-09-29 DIAGNOSIS — H409 Unspecified glaucoma: Secondary | ICD-10-CM

## 2019-09-29 DIAGNOSIS — R7303 Prediabetes: Secondary | ICD-10-CM | POA: Diagnosis not present

## 2019-09-29 DIAGNOSIS — I10 Essential (primary) hypertension: Secondary | ICD-10-CM

## 2019-09-29 MED ORDER — CHLORTHALIDONE 25 MG PO TABS: 12.5000 mg | ORAL_TABLET | Freq: Every day | ORAL | 3 refills | Status: DC

## 2019-09-29 MED ORDER — AMLODIPINE BESYLATE 5 MG PO TABS: 2.5000 mg | ORAL_TABLET | Freq: Every day | ORAL | 3 refills | Status: DC

## 2019-09-29 NOTE — Patient Instructions (Addendum)
Ref. Range 05/18/2018 09:01 10/28/2018 15:25 09/16/2019 09:52  Cholesterol Latest Ref Range: 0 - 200 mg/dL 215 (H)  227 (H)  HDL Cholesterol Latest Ref Range: >39.00 mg/dL 54.90  60.50  LDL (calc) Latest Ref Range: 0 - 99 mg/dL 122 (H)  143 (H)  NonHDL Unknown 159.75  166.80  Triglycerides Latest Ref Range: 0.0 - 149.0 mg/dL 188.0 (H)  120.0  VLDL Latest Ref Range: 0.0 - 40.0 mg/dL 37.6  24.0    Consider low dose aspirin 81 every other day   High Cholesterol  High cholesterol is a condition in which the blood has high levels of a white, waxy, fat-like substance (cholesterol). The human body needs small amounts of cholesterol. The liver makes all the cholesterol that the body needs. Extra (excess) cholesterol comes from the food that we eat. Cholesterol is carried from the liver by the blood through the blood vessels. If you have high cholesterol, deposits (plaques) may build up on the walls of your blood vessels (arteries). Plaques make the arteries narrower and stiffer. Cholesterol plaques increase your risk for heart attack and stroke. Work with your health care provider to keep your cholesterol levels in a healthy range. What increases the risk? This condition is more likely to develop in people who:  Eat foods that are high in animal fat (saturated fat) or cholesterol.  Are overweight.  Are not getting enough exercise.  Have a family history of high cholesterol. What are the signs or symptoms? There are no symptoms of this condition. How is this diagnosed? This condition may be diagnosed from the results of a blood test.  If you are older than age 26, your health care provider may check your cholesterol every 4-6 years.  You may be checked more often if you already have high cholesterol or other risk factors for heart disease. The blood test for cholesterol measures:  "Bad" cholesterol (LDL cholesterol). This is the main type of cholesterol that causes heart disease. The  desired level for LDL is less than 100.  "Good" cholesterol (HDL cholesterol). This type helps to protect against heart disease by cleaning the arteries and carrying the LDL away. The desired level for HDL is 60 or higher.  Triglycerides. These are fats that the body can store or burn for energy. The desired number for triglycerides is lower than 150.  Total cholesterol. This is a measure of the total amount of cholesterol in your blood, including LDL cholesterol, HDL cholesterol, and triglycerides. A healthy number is less than 200. How is this treated? This condition is treated with diet changes, lifestyle changes, and medicines. Diet changes  This may include eating more whole grains, fruits, vegetables, nuts, and fish.  This may also include cutting back on red meat and foods that have a lot of added sugar. Lifestyle changes  Changes may include getting at least 40 minutes of aerobic exercise 3 times a week. Aerobic exercises include walking, biking, and swimming. Aerobic exercise along with a healthy diet can help you maintain a healthy weight.  Changes may also include quitting smoking. Medicines  Medicines are usually given if diet and lifestyle changes have failed to reduce your cholesterol to healthy levels.  Your health care provider may prescribe a statin medicine. Statin medicines have been shown to reduce cholesterol, which can reduce the risk of heart disease. Follow these instructions at home: Eating and drinking If told by your health care provider:  Eat chicken (without skin), fish, veal, shellfish, ground [place]  breast, and round or loin cuts of red meat.  Do not eat fried foods or fatty meats, such as hot dogs and salami.  Eat plenty of fruits, such as apples.  Eat plenty of vegetables, such as broccoli, potatoes, and carrots.  Eat beans, peas, and lentils.  Eat grains such as barley, rice, couscous, and bulgur wheat.  Eat pasta without cream sauces.  Use  skim or nonfat milk, and eat low-fat or nonfat yogurt and cheeses.  Do not eat or drink whole milk, cream, ice cream, egg yolks, or hard cheeses.  Do not eat stick margarine or tub margarines that contain trans fats (also called partially hydrogenated oils).  Do not eat saturated tropical oils, such as coconut oil and palm oil.  Do not eat cakes, cookies, crackers, or other baked goods that contain trans fats.  General instructions  Exercise as directed by your health care provider. Increase your activity level with activities such as gardening, walking, and taking the stairs.  Take over-the-counter and prescription medicines only as told by your health care provider.  Do not use any products that contain nicotine or tobacco, such as cigarettes and e-cigarettes. If you need help quitting, ask your health care provider.  Keep all follow-up visits as told by your health care provider. This is important. Contact a health care provider if:  You are struggling to maintain a healthy diet or weight.  You need help to start on an exercise program.  You need help to stop smoking. Get help right away if:  You have chest pain.  You have trouble breathing. This information is not intended to replace advice given to you by your health care provider. Make sure you discuss any questions you have with your health care provider. Document Released: 10/27/2005 Document Revised: 10/30/2017 Document Reviewed: 04/26/2016 Elsevier Patient Education  [place].  Cholesterol Content in Foods Cholesterol is a waxy, fat-like substance that helps to carry fat in the blood. The body needs cholesterol in small amounts, but too much cholesterol can cause damage to the arteries and heart. Most people should eat less than 200 milligrams (mg) of cholesterol a day. Foods with cholesterol  Cholesterol is found in animal-based foods, such as meat, seafood, and dairy. Generally, low-fat dairy and lean  meats have less cholesterol than full-fat dairy and fatty meats. The milligrams of cholesterol per serving (mg per serving) of common cholesterol-containing foods are listed below. Meat and other proteins  Egg -- one large whole egg has 186 mg.  Veal shank -- 4 oz has 141 mg.  Lean ground [place] (93% lean) -- 4 oz has 118 mg.  Fat-trimmed lamb loin -- 4 oz has 106 mg.  Lean ground beef (90% lean) -- 4 oz has 100 mg.  Lobster -- 3.5 oz has 90 mg.  Pork loin chops -- 4 oz has 86 mg.  Canned salmon -- 3.5 oz has 83 mg.  Fat-trimmed beef top loin -- 4 oz has 78 mg.  Frankfurter -- 1 frank (3.5 oz) has 77 mg.  Crab -- 3.5 oz has 71 mg.  Roasted chicken without skin, white meat -- 4 oz has 66 mg.  Light bologna -- 2 oz has 45 mg.  Deli-cut [place] -- 2 oz has 31 mg.  Canned tuna -- 3.5 oz has 31 mg.  Berniece Salines -- 1 oz has 29 mg.  Oysters and mussels (raw) -- 3.5 oz has 25 mg.  Mackerel -- 1 oz has 22 mg.  Trout --  1 oz has 20 mg.  Pork sausage -- 1 link (1 oz) has 17 mg.  Salmon -- 1 oz has 16 mg.  Tilapia -- 1 oz has 14 mg. Dairy  Soft-serve ice cream --  cup (4 oz) has 103 mg.  Whole-milk yogurt -- 1 cup (8 oz) has 29 mg.  Cheddar cheese -- 1 oz has 28 mg.  American cheese -- 1 oz has 28 mg.  Whole milk -- 1 cup (8 oz) has 23 mg.  2% milk -- 1 cup (8 oz) has 18 mg.  Cream cheese -- 1 tablespoon (Tbsp) has 15 mg.  Cottage cheese --  cup (4 oz) has 14 mg.  Low-fat (1%) milk -- 1 cup (8 oz) has 10 mg.  Sour cream -- 1 Tbsp has 8.5 mg.  Low-fat yogurt -- 1 cup (8 oz) has 8 mg.  Nonfat Greek yogurt -- 1 cup (8 oz) has 7 mg.  Half-and-half cream -- 1 Tbsp has 5 mg. Fats and oils  Cod liver oil -- 1 tablespoon (Tbsp) has 82 mg.  Butter -- 1 Tbsp has 15 mg.  Lard -- 1 Tbsp has 14 mg.  Bacon grease -- 1 Tbsp has 14 mg.  Mayonnaise -- 1 Tbsp has 5-10 mg.  Margarine -- 1 Tbsp has 3-10 mg. Exact amounts of cholesterol in these foods may vary  depending on specific ingredients and brands. Foods without cholesterol Most plant-based foods do not have cholesterol unless you combine them with a food that has cholesterol. Foods without cholesterol include:  Grains and cereals.  Vegetables.  Fruits.  Vegetable oils, such as olive, canola, and sunflower oil.  Legumes, such as peas, beans, and lentils.  Nuts and seeds.  Egg whites. Summary  The body needs cholesterol in small amounts, but too much cholesterol can cause damage to the arteries and heart.  Most people should eat less than 200 milligrams (mg) of cholesterol a day. This information is not intended to replace advice given to you by your health care provider. Make sure you discuss any questions you have with your health care provider. Document Released: 06/23/2017 Document Revised: 10/09/2017 Document Reviewed: 06/23/2017 Elsevier Patient Education  2020 [place] Following a healthy eating pattern may help you to achieve and maintain a healthy body weight, reduce the risk of chronic disease, and live a long and productive life. It is important to follow a healthy eating pattern at an appropriate calorie level for your body. Your nutritional needs should be met primarily through food by choosing a variety of nutrient-rich foods. What are tips for following this plan? Reading food labels  Read labels and choose the following: ? Reduced or low sodium. ? Juices with 100% fruit juice. ? Foods with low saturated fats and high polyunsaturated and monounsaturated fats. ? Foods with whole grains, such as whole wheat, cracked wheat, brown rice, and wild rice. ? Whole grains that are fortified with folic acid. This is recommended for women who are pregnant or who want to become pregnant.  Read labels and avoid the following: ? Foods with a lot of added sugars. These include foods that contain brown sugar, corn sweetener, corn syrup, dextrose, fructose,  glucose, high-fructose corn syrup, honey, invert sugar, lactose, malt syrup, maltose, molasses, raw sugar, sucrose, trehalose, or turbinado sugar.  Do not eat more than the following amounts of added sugar per day:  6 teaspoons (25 g) for women.  9 teaspoons (38 g) for men. ? Foods  that contain processed or refined starches and grains. ? Refined grain products, such as white flour, degermed cornmeal, white bread, and white rice. Shopping  Choose nutrient-rich snacks, such as vegetables, whole fruits, and nuts. Avoid high-calorie and high-sugar snacks, such as potato chips, fruit snacks, and candy.  Use oil-based dressings and spreads on foods instead of solid fats such as butter, stick margarine, or cream cheese.  Limit pre-made sauces, mixes, and "instant" products such as flavored rice, instant noodles, and ready-made pasta.  Try more plant-protein sources, such as tofu, tempeh, black beans, edamame, lentils, nuts, and seeds.  Explore eating plans such as the Mediterranean diet or vegetarian diet. Cooking  Use oil to saut or stir-fry foods instead of solid fats such as butter, stick margarine, or lard.  Try baking, boiling, grilling, or broiling instead of frying.  Remove the fatty part of meats before cooking.  Steam vegetables in water or broth. Meal planning   At meals, imagine dividing your plate into fourths: ? One-half of your plate is fruits and vegetables. ? One-fourth of your plate is whole grains. ? One-fourth of your plate is protein, especially lean meats, poultry, eggs, tofu, beans, or nuts.  Include low-fat dairy as part of your daily diet. Lifestyle  Choose healthy options in all settings, including home, work, school, restaurants, or stores.  Prepare your food safely: ? Wash your hands after handling raw meats. ? Keep food preparation surfaces clean by regularly washing with hot, soapy water. ? Keep raw meats separate from ready-to-eat foods, such as  fruits and vegetables. ? Cook seafood, meat, poultry, and eggs to the recommended internal temperature. ? Store foods at safe temperatures. In general:  Keep cold foods at 39F (4.4C) or below.  Keep hot foods at 139F (60C) or above.  Keep your freezer at [facility] (-17.8C) or below.  Foods are no longer safe to eat when they have been between the temperatures of 40-139F (4.4-60C) for more than 2 hours. What foods should I eat? Fruits Aim to eat 2 cup-equivalents of fresh, canned (in natural juice), or frozen fruits each day. Examples of 1 cup-equivalent of fruit include 1 small apple, 8 large strawberries, 1 cup canned fruit,  cup dried fruit, or 1 cup 100% juice. Vegetables Aim to eat 2-3 cup-equivalents of fresh and frozen vegetables each day, including different varieties and colors. Examples of 1 cup-equivalent of vegetables include 2 medium carrots, 2 cups raw, leafy greens, 1 cup chopped vegetable (raw or cooked), or 1 medium baked potato. Grains Aim to eat 6 ounce-equivalents of whole grains each day. Examples of 1 ounce-equivalent of grains include 1 slice of bread, 1 cup ready-to-eat cereal, 3 cups popcorn, or  cup cooked rice, pasta, or cereal. Meats and other proteins Aim to eat 5-6 ounce-equivalents of protein each day. Examples of 1 ounce-equivalent of protein include 1 egg, 1/2 cup nuts or seeds, or 1 tablespoon (16 g) peanut butter. A cut of meat or fish that is the size of a deck of cards is about 3-4 ounce-equivalents.  Of the protein you eat each week, try to have at least 8 ounces come from seafood. This includes salmon, trout, herring, and anchovies. Dairy Aim to eat 3 cup-equivalents of fat-free or low-fat dairy each day. Examples of 1 cup-equivalent of dairy include 1 cup (240 mL) milk, 8 ounces (250 g) yogurt, 1 ounces (44 g) natural cheese, or 1 cup (240 mL) fortified soy milk. Fats and oils  Aim for about 5 teaspoons (  21 g) per day. Choose monounsaturated  fats, such as canola and olive oils, avocados, peanut butter, and most nuts, or polyunsaturated fats, such as sunflower, corn, and soybean oils, walnuts, pine nuts, sesame seeds, sunflower seeds, and flaxseed. Beverages  Aim for six 8-oz glasses of water per day. Limit coffee to three to five 8-oz cups per day.  Limit caffeinated beverages that have added calories, such as soda and energy drinks.  Limit alcohol intake to no more than 1 drink a day for nonpregnant women and 2 drinks a day for men. One drink equals 12 oz of beer (355 mL), 5 oz of wine (148 mL), or 1 oz of hard liquor (44 mL). Seasoning and other foods  Avoid adding excess amounts of salt to your foods. Try flavoring foods with herbs and spices instead of salt.  Avoid adding sugar to foods.  Try using oil-based dressings, sauces, and spreads instead of solid fats. This information is based on general [place] nutrition guidelines. For more information, visit [employer]. Exact amounts may vary based on your nutrition needs. Summary  A healthy eating plan may help you to maintain a healthy weight, reduce the risk of chronic diseases, and stay active throughout your life.  Plan your meals. Make sure you eat the right portions of a variety of nutrient-rich foods.  Try baking, boiling, grilling, or broiling instead of frying.  Choose healthy options in all settings, including home, work, school, restaurants, or stores. This information is not intended to replace advice given to you by your health care provider. Make sure you discuss any questions you have with your health care provider. Document Released: 02/08/2018 Document Revised: 02/08/2018 Document Reviewed: 02/08/2018 Elsevier Patient Education  2020 [employer].

## 2019-09-29 NOTE — Progress Notes (Signed)
telephone Note  I connected with Diana Monroe   on 09/29/19 at 11:00 AM EST by telephone and verified that I am speaking with the correct person using two identifiers.  Location patient: home Location provider:work or home office Persons participating in the virtual visit: patient, provider  I discussed the limitations of evaluation and management by telemedicine and the availability of in person appointments. The patient expressed understanding and agreed to proceed.   HPI: 1. HTN on norvasc 2.5-5 mg qd and chlorthalidone 12.5 mg qd BP doing ok 120s/70s avg  2. HLD doe snot want to try statin agreeable to try aspirin but c/w side effects will try qod  3. Reviewed labs 09/16/2019 will mail copy pt will start exercising more and needs refills of meds    ROS: See pertinent positives and negatives per HPI.  Past Medical History:  Diagnosis Date  . Chicken pox   . Glaucoma    left eye [place] [facility]   . Hyperlipidemia   . Hypertension   . Vitamin D deficiency   . Wears glasses     Past Surgical History:  Procedure Laterality Date  . CESAREAN SECTION     x 1     Family History  Problem Relation Age of Onset  . Stroke Mother   . Hypertension Mother   . Diabetes Mother   . Cancer Father        prostate   . Hyperlipidemia Father   . Diabetes Father   . Hyperlipidemia Other        grandparents   . Breast cancer Neg Hx     SOCIAL HX: married    Current Outpatient Medications:  .  amLODipine (NORVASC) 5 MG tablet, Take 0.5-1 tablets (2.5-5 mg total) by mouth daily. Take 5 mg dose if Blood pressure >130/>80, Disp: 90 tablet, Rfl: 3 .  aspirin 81 MG EC tablet, Take 81 mg by mouth every other day. Swallow whole., Disp: , Rfl:  .  chlorthalidone (HYGROTON) 25 MG tablet, Take 0.5 tablets (12.5 mg total) by mouth daily. In the morning, Disp: 45 tablet, Rfl: 3 .  dorzolamide-timolol (COSOPT) 22.3-6.8 MG/ML ophthalmic solution, Place 1 drop into the left eye 2 (two) times daily.,  Disp: , Rfl:  .  LATANOPROST OP, Apply 1 drop to eye 1 day or 1 dose. B/l eyes 1x, Disp: , Rfl:  .  magnesium 30 MG tablet, Take 30 mg daily by mouth., Disp: , Rfl:  .  vitamin B-12 (CYANOCOBALAMIN) 100 MCG tablet, Take 100 mcg daily by mouth., Disp: , Rfl:   EXAM: before failed video  VITALS per patient if applicable:  GENERAL: alert, oriented, appears well and in no acute distress  HEENT: atraumatic, conjunttiva clear, no obvious abnormalities on inspection of external nose and ears  NECK: normal movements of the head and neck  LUNGS: on inspection no signs of respiratory distress, breathing rate appears normal, no obvious gross SOB, gasping or wheezing  CV: no obvious cyanosis  MS: moves all visible extremities without noticeable abnormality  PSYCH/NEURO: pleasant and cooperative, no obvious depression or anxiety, speech and thought processing grossly intact  ASSESSMENT AND PLAN:  Discussed the following assessment and plan:  Essential hypertension - Plan: amLODipine (NORVASC) 2.5-5 MG tablet, chlorthalidone (HYGROTON) 12.5 MG tablet Monitor BP  F/u in 6 months repeat labs   Hyperlipidemia, unspecified hyperlipidemia type declines statin  Disc aspirin EC 81 mg qod with pt   Glaucoma of left eye, unspecified glaucoma type Due to  f/u but has post poned for now using drops   Prediabetes 5.8 09/16/2019  -rec healthy diet and exercise   HM Declines vaccines (shingrix, prevnar, flu).  -Tdap due 2020 given Rx prev.   MMR immune  04/20/18 cologaurd  mammo UTD neg 10/28/18 normal sch 10/31/19  Pap UTD 10/22/17 negative pap neg HPV dexa6/26/19 osteopenia rec calcium and vit D5000 IU D3 qd   Eye MD appt not sch as of 09/29/19 due  Dentist cancelled due pandemic  Skin no issues as of 09/29/2019   -we discussed possible serious and likely etiologies, options for evaluation and workup, limitations of telemedicine visit vs in person visit, treatment, treatment risks and  precautions. Pt prefers to treat via telemedicine empirically rather then risking or undertaking an in person visit at this moment. Patient agrees to seek prompt in person care if worsening, new symptoms arise, or if is not improving with treatment.   I discussed the assessment and treatment plan with the patient. The patient was provided an opportunity to ask questions and all were answered. The patient agreed with the plan and demonstrated an understanding of the instructions.   The patient was advised to call back or seek an in-person evaluation if the symptoms worsen or if the condition fails to improve as anticipated.  Time spent 15 minutes  Delorise Jackson, MD

## 2019-09-30 ENCOUNTER — Telehealth: Payer: Self-pay | Admitting: Internal Medicine

## 2019-09-30 NOTE — Telephone Encounter (Signed)
I called pt and left vm for pt to call ofc to schedule Return in about 6 months (around 03/28/2020).

## 2019-10-31 ENCOUNTER — Ambulatory Visit
Admission: RE | Admit: 2019-10-31 | Discharge: 2019-10-31 | Disposition: A | Discharge status: Home / Self Care | Payer: Medicare Other | Source: Ambulatory Visit | Attending: Internal Medicine | Admitting: Internal Medicine

## 2019-10-31 DIAGNOSIS — Z1231 Encounter for screening mammogram for malignant neoplasm of breast: Secondary | ICD-10-CM | POA: Diagnosis not present

## 2019-11-01 DIAGNOSIS — Z20828 Contact with and (suspected) exposure to other viral communicable diseases: Secondary | ICD-10-CM | POA: Diagnosis not present

## 2019-12-24 IMAGING — MG DIGITAL SCREENING BILAT W/ TOMO W/ CAD
6 of 10 series · 6 of 30 positions shown · non-contrast
Comparison: Previous exam(s).

CLINICAL DATA: Screening.

EXAM:
DIGITAL SCREENING BILATERAL MAMMOGRAM WITH TOMO AND CAD

[R MLO synth-2D]
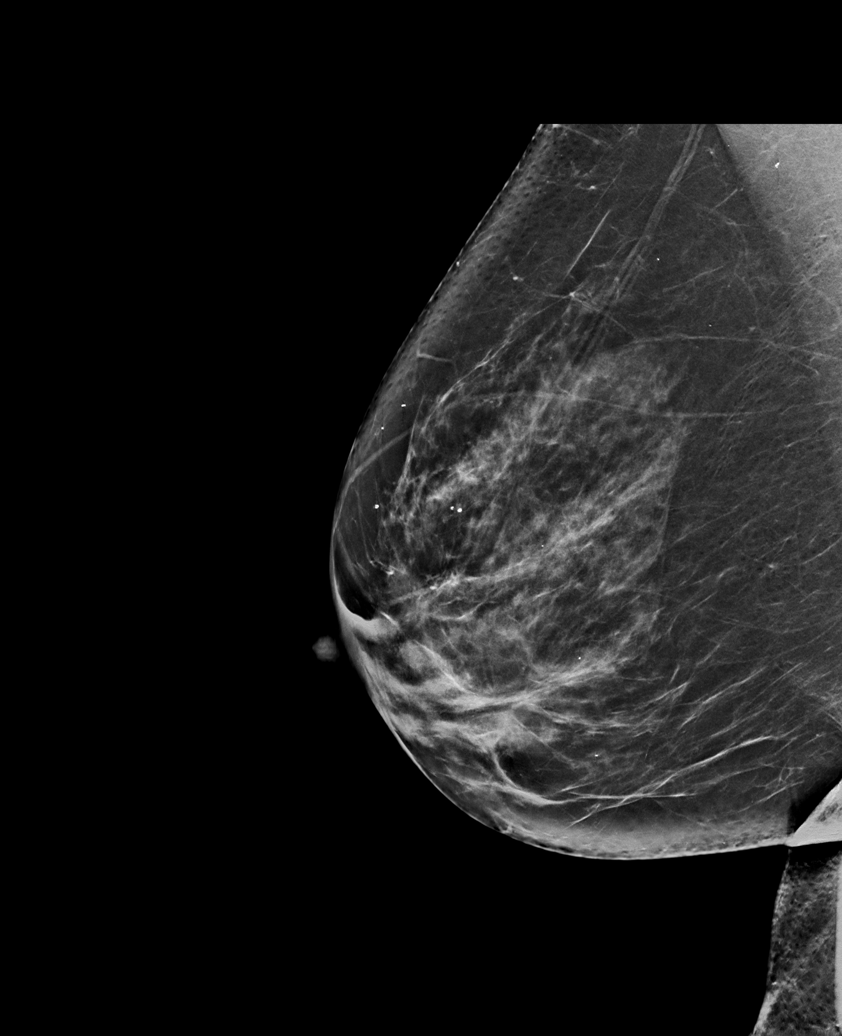

[L CC synth-2D]
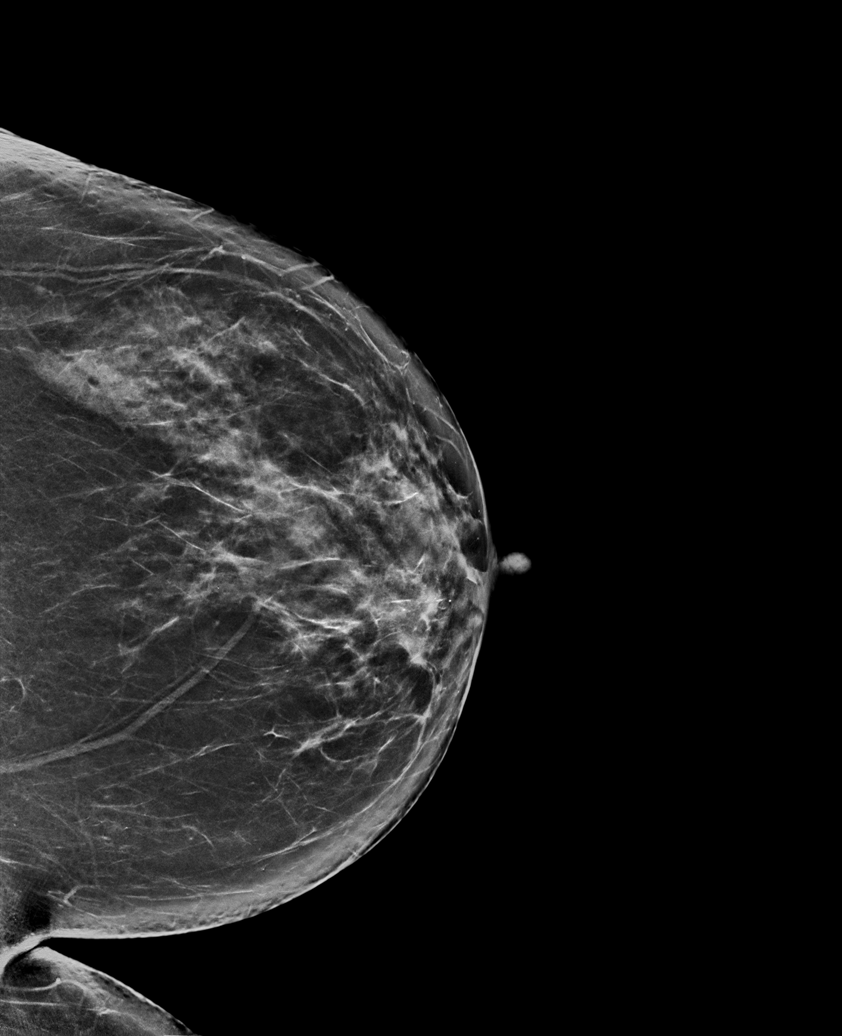

[L MLO synth-2D (1 of 2)]
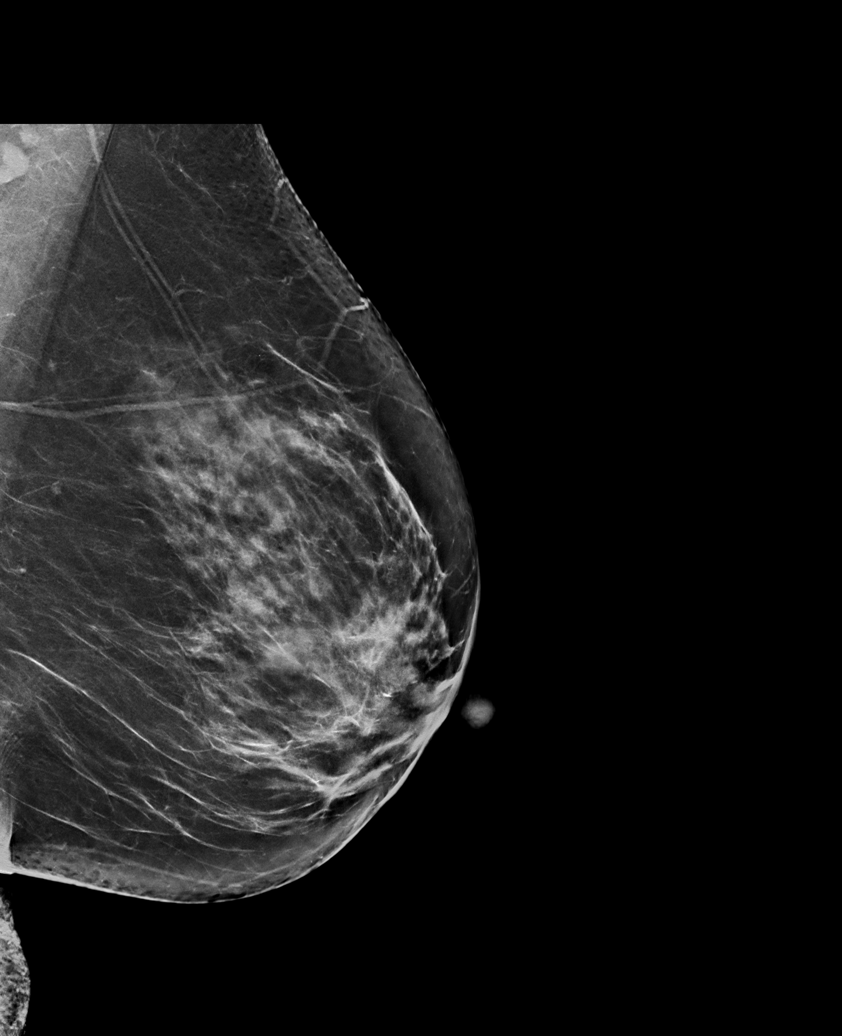

[L MLO synth-2D (2 of 2)]
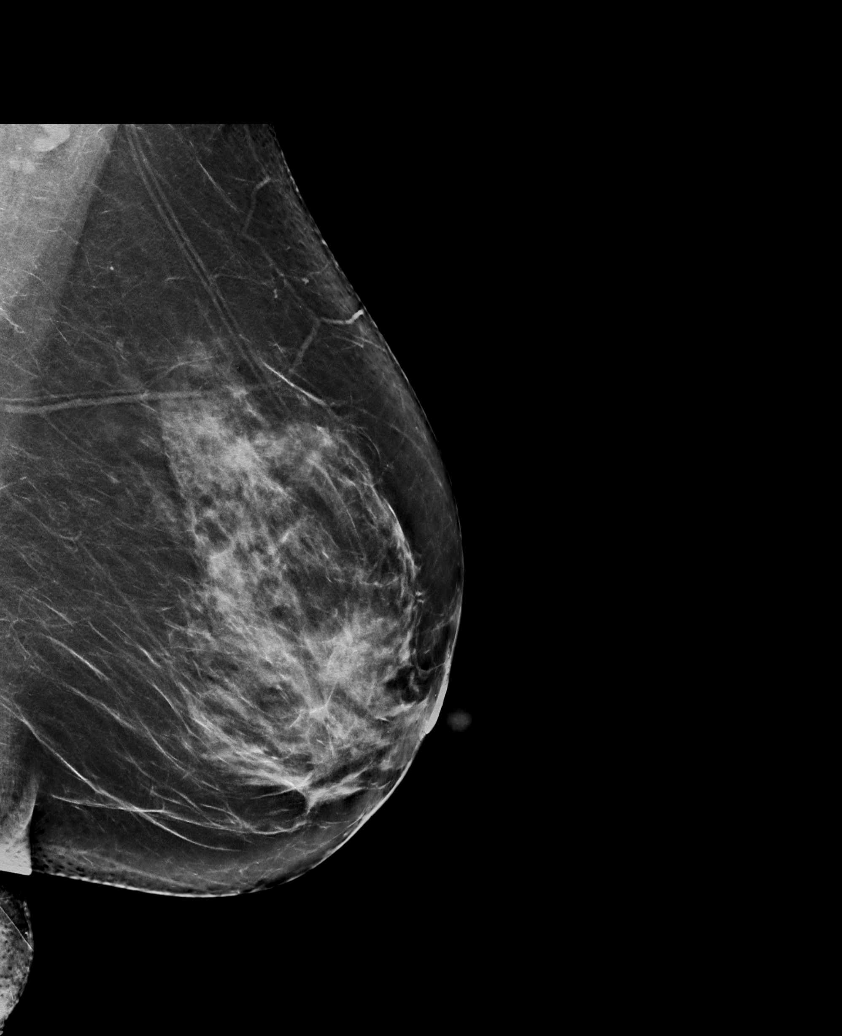

[R CC synth-2D]
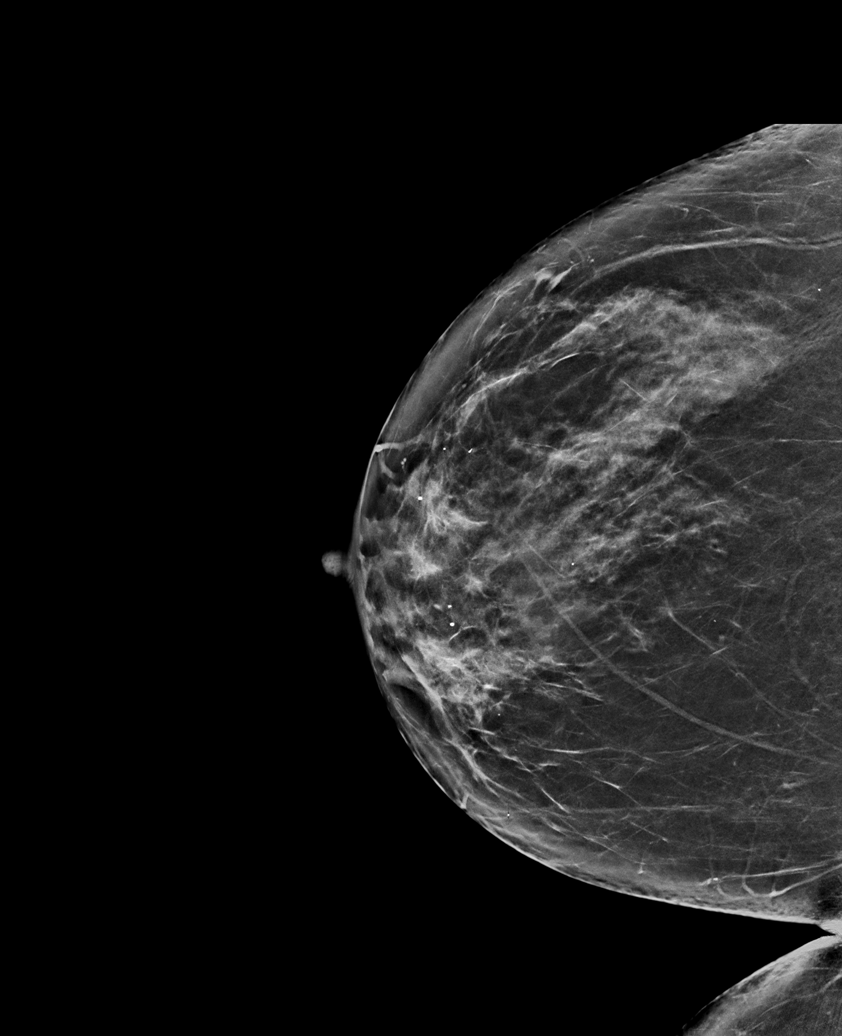

[L MLO tomo · tomo slice 43/84.0]
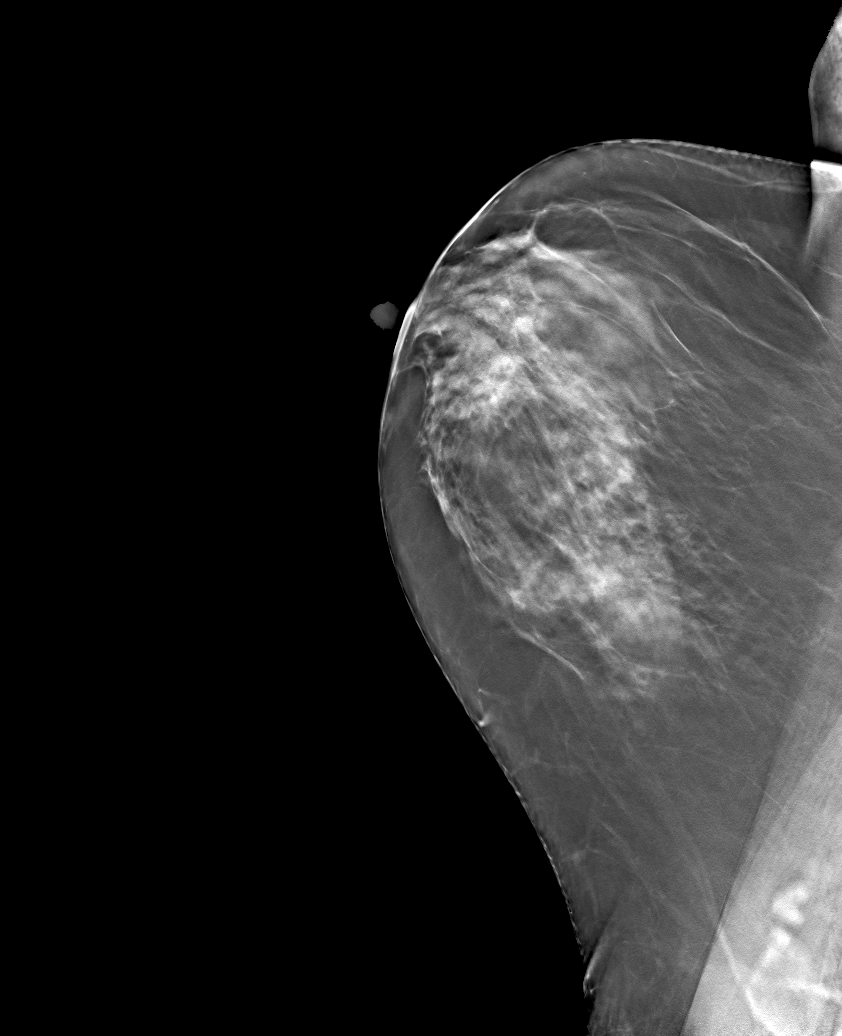

[6 of 30 positions shown; findings below may reference images not displayed]

ACR Breast Density Category c: The breast tissue is heterogeneously
dense, which may obscure small masses.
FINDINGS: There are no findings suspicious for malignancy. Images were
processed with CAD.
IMPRESSION: No mammographic evidence of malignancy. A result letter of this
screening mammogram will be mailed directly to the patient.

RECOMMENDATION:
Screening mammogram in one year. (Code:[id number])

BI-RADS CATEGORY  1: Negative.

## 2020-08-30 ENCOUNTER — Telehealth: Payer: Self-pay | Admitting: Internal Medicine

## 2020-08-30 NOTE — Telephone Encounter (Signed)
I called the patient to inform her that I updated her insurance information on line an according to the automated phone information they will update the insurance information and refill the claim. The patient voiced her understanding.

## 2020-08-30 NOTE — Telephone Encounter (Signed)
I left the patient a message regarding her [facility] bill .  I will need the patient's account # off of the bill and also the name of the labs that were billed.

## 2020-09-07 ENCOUNTER — Ambulatory Visit (INDEPENDENT_AMBULATORY_CARE_PROVIDER_SITE_OTHER): Payer: Medicare Other

## 2020-09-07 ENCOUNTER — Ambulatory Visit: Payer: Medicare Other

## 2020-09-07 VITALS — Ht 64.0 in | Wt 164.0 lb

## 2020-09-07 DIAGNOSIS — Z0000 Encounter for general adult medical examination without abnormal findings: Secondary | ICD-10-CM

## 2020-09-07 NOTE — Progress Notes (Signed)
Subjective:   Diana Monroe is a 69 y.o. female who presents for Medicare Annual (Subsequent) preventive examination.  Review of Systems    No ROS.  Medicare Wellness Virtual Visit.     Cardiac Risk Factors include: advanced age (>36men, >6 women);hypertension     Objective:    Today's Vitals   09/07/20 1115  Weight: 164 lb (74.4 kg)  Height: 5\' 4"  (1.626 m)   Body mass index is 28.15 kg/m.  Advanced Directives 09/07/2020 09/07/2019  Does Patient Have a Medical Advance Directive? Yes No  Type of Paramedic of [place];Living will -  Does patient want to make changes to medical advance directive? No - Patient declined -  Copy of [place] in Chart? No - copy requested -  Would patient like information on creating a medical advance directive? - No - Patient declined    Current Medications (verified) Outpatient Encounter Medications as of 09/07/2020  Medication Sig  . amLODipine (NORVASC) 5 MG tablet Take 0.5-1 tablets (2.5-5 mg total) by mouth daily. Take 5 mg dose if Blood pressure >130/>80  . aspirin 81 MG EC tablet Take 81 mg by mouth every other day. Swallow whole.  . calcium-vitamin D (OSCAL WITH D) 500-200 MG-UNIT TABS tablet Take by mouth.  . chlorthalidone (HYGROTON) 25 MG tablet Take 0.5 tablets (12.5 mg total) by mouth daily. In the morning  . dorzolamide-timolol (COSOPT) 22.3-6.8 MG/ML ophthalmic solution Place 1 drop into the left eye 2 (two) times daily.  Marland Kitchen LATANOPROST OP Apply 1 drop to eye 1 day or 1 dose. B/l eyes 1x  . magnesium 30 MG tablet Take 30 mg daily by mouth.  . vitamin B-12 (CYANOCOBALAMIN) 100 MCG tablet Take 100 mcg daily by mouth.   No facility-administered encounter medications on file as of 09/07/2020.    Allergies (verified) Lisinopril   History: Past Medical History:  Diagnosis Date  . Chicken pox   . Glaucoma    left eye [place] eye   . Hyperlipidemia   . Hypertension   . Vitamin  D deficiency   . Wears glasses    Past Surgical History:  Procedure Laterality Date  . CESAREAN SECTION     x 1    Family History  Problem Relation Age of Onset  . Stroke Mother   . Hypertension Mother   . Diabetes Mother   . Cancer Father        prostate   . Hyperlipidemia Father   . Diabetes Father   . Hyperlipidemia Other        grandparents   . Breast cancer Neg Hx    Social History   Socioeconomic History  . Marital status: Married    Spouse name: Not on file  . Number of children: Not on file  . Years of education: Not on file  . Highest education level: Not on file  Occupational History  . Occupation: retired  Tobacco Use  . Smoking status: Never Smoker  . Smokeless tobacco: Never Used  Vaping Use  . Vaping Use: Never used  Substance and Sexual Activity  . Alcohol use: Yes    Comment: occasional  . Drug use: No  . Sexual activity: Not on file  Other Topics Concern  . Not on file  Social History Narrative   Retired (previous jobs Science writer, private investigation fraud, [employer]   Lived in [place] for a while with husband who is from there   Lived all over  [place]. From [place]    1 son lives in [place] now but lived [place] prior with 1 grandson in [place] and 1 grandson in [place]. Wine denies cig/drugs    Likes to exercise    Sexually active with current husband since age 68 y.o    Family origin from [place]    Social Determinants of Health   Financial Resource Strain: Low Risk   . Difficulty of Paying Living Expenses: Not hard at all  Food Insecurity: No Food Insecurity  . Worried About Charity fundraiser in the Last Year: Never true  . Ran Out of Food in the Last Year: Never true  Transportation Needs: No Transportation Needs  . Lack of Transportation (Medical): No  . Lack of Transportation (Non-Medical): No  Physical Activity:   . Days of Exercise per Week: Not on file  . Minutes of Exercise per Session: Not on file  Stress: No  Stress Concern Present  . Feeling of Stress : Not at all  Social Connections: Unknown  . Frequency of Communication with Friends and Family: More than three times a week  . Frequency of Social Gatherings with Friends and Family: More than three times a week  . Attends Religious Services: Not on file  . Active Member of Clubs or Organizations: Not on file  . Attends Archivist Meetings: Not on file  . Marital Status: Married    Tobacco Counseling Counseling given: Not Answered   Clinical Intake:  Pre-visit preparation completed: Yes        Diabetes: No  How often do you need to have someone help you when you read instructions, pamphlets, or other written materials from your doctor or pharmacy?: 1 - Never   Interpreter Needed?: No      Activities of Daily Living In your present state of health, do you have any difficulty performing the following activities: 09/07/2020  Hearing? N  Vision? N  Difficulty concentrating or making decisions? N  Walking or climbing stairs? N  Dressing or bathing? N  Doing errands, shopping? N  Preparing Food and eating ? N  Using the Toilet? N  In the past six months, have you accidently leaked urine? N  Do you have problems with loss of bowel control? N  Managing your Medications? N  Managing your Finances? N  Housekeeping or managing your Housekeeping? N  Some recent data might be hidden    Patient Care Team: McLean-Scocuzza, Nino Glow, MD as PCP - General (Internal Medicine)  Indicate any recent Medical Services you may have received from other than [facility] providers in the past year (date may be approximate).     Assessment:   This is a routine wellness examination for Chellie.  I connected with Garyn today by telephone and verified that I am speaking with the correct person using two identifiers. Location patient: home Location provider: work Persons participating in the virtual visit: patient, Marine scientist.    I  discussed the limitations, risks, security and privacy concerns of performing an evaluation and management service by telephone and the availability of in person appointments. The patient expressed understanding and verbally consented to this telephonic visit.    Interactive audio and video telecommunications were attempted between this provider and patient, however failed, due to patient having technical difficulties OR patient did not have access to video capability.  We continued and completed visit with audio only.  Some vital signs may be absent or patient reported.  Hearing/Vision screen  Hearing Screening   125Hz  250Hz  500Hz  1000Hz  2000Hz  3000Hz  4000Hz  6000Hz  8000Hz   Right ear:           Left ear:           Comments: Patient is able to hear conversational tones without difficulty. No issues reported.  Vision Screening Comments: Wears corrective lenses  Visual acuity not assessed, virtual visit.  Dietary issues and exercise activities discussed: Current Exercise Habits: Home exercise routine, Intensity: Mild  Healthy diet Good water intake  Goals    . Follow up with Primary Care Provider     As scheduled and needed       Depression Screen PHQ 2/9 Scores 09/07/2020 09/29/2019 09/07/2019  PHQ - 2 Score 0 0 0    Fall Risk Fall Risk  09/07/2020 09/29/2019 09/07/2019 06/09/2019 09/28/2017  Falls in the past year? 0 0 0 0 No  Comment - - - Emmi Telephone Survey: data to providers prior to load -  Number falls in past yr: 0 - - - -  Follow up Falls evaluation completed - - - -   Handrails in use when climbing stairs? Yes Home free of loose throw rugs in walkways, pet beds, electrical cords, etc? Yes  Adequate lighting in your home to reduce risk of falls? Yes   ASSISTIVE DEVICES UTILIZED TO PREVENT FALLS: Life alert? No  Use of a cane, walker or w/c? No   TIMED UP AND GO: Was the test performed? No . Virtual visit.   Cognitive Function:  Patient is alert and oriented  x3.  Denies difficulty focusing, making decisions, memory loss.  Enjoys brain challenging games for brain health.  MMSE deferred. 6CIT complete.     6CIT Screen 09/07/2020 09/07/2019  What Year? 0 points 0 points  What month? 0 points 0 points  What time? 0 points 0 points  Count back from 20 0 points 0 points  Months in reverse 0 points 0 points  Repeat phrase - 0 points  Total Score - 0    Immunizations  There is no immunization history on file for this patient. Covid, Pneumococcal, Influenza, vaccine discontinued per patient preference.   TDAP status: Due, Education has been provided regarding the importance of this vaccine. Advised may receive this vaccine at local pharmacy or Health Dept. Aware to provide a copy of the vaccination record if obtained from local pharmacy or Health Dept. Verbalized acceptance and understanding. Deferred.    Health Maintenance Health Maintenance  Topic Date Due  . COLONOSCOPY  09/28/2020 (Originally 05/25/2001)  . TETANUS/TDAP  10/17/2020 (Originally 05/25/1970)  . MAMMOGRAM  10/30/2021  . DEXA SCAN  Completed  . Hepatitis C Screening  Completed  . INFLUENZA VACCINE  Discontinued  . COVID-19 Vaccine  Discontinued  . PNA vac Low Risk Adult  Discontinued   Lung Cancer Screening: (Low Dose CT Chest recommended if Age 4-80 years, 30 pack-year currently smoking OR have quit w/in 15years.) does not qualify.    Hepatitis C Screening: Completed 09/16/19.   Vision Screening: Recommended annual ophthalmology exams for early detection of glaucoma and other disorders of the eye. Is the patient up to date with their annual eye exam?  Yes  Who is the provider or what is the name of the office in which the patient attends annual eye exams?   Dental Screening: Recommended annual dental exams for proper oral hygiene. Visits every 6 months. [facility]   Community Resource Referral / Chronic  Care Management: CRR required this visit?  No     CCM required this visit?  No      Plan:   Keep all routine maintenance appointments.   Cpe 10/17/20 @ 9:00; fasting for labs  I have personally reviewed and noted the following in the patient's chart:   . Medical and social history . Use of alcohol, tobacco or illicit drugs  . Current medications and supplements . Functional ability and status . Nutritional status . Physical activity . Advanced directives . List of other physicians . Hospitalizations, surgeries, and ER visits in previous 12 months . Vitals . Screenings to include cognitive, depression, and falls . Referrals and appointments  In addition, I have reviewed and discussed with patient certain preventive protocols, quality metrics, and best practice recommendations. A written personalized care plan for preventive services as well as general preventive health recommendations were provided to patient via mail.     Varney Biles, LPN   01/75/1025

## 2020-09-07 NOTE — Patient Instructions (Addendum)
[name] , Thank you for taking time to come for your Medicare Wellness Visit. I appreciate your ongoing commitment to your health goals. Please review the following plan we discussed and let me know if I can assist you in the future.   These are the goals we discussed: Goals    . Follow up with Primary Care Provider     As scheduled and needed        This is a list of the screening recommended for you and due dates:  Health Maintenance  Topic Date Due  . Colon Cancer Screening  09/28/2020*  . Tetanus Vaccine  10/17/2020*  . Mammogram  10/30/2021  . DEXA scan (bone density measurement)  Completed  .  Hepatitis C: One time screening is recommended by Center for Disease Control  (CDC) for  adults born from [birth year] through 1965.   Completed  . Flu Shot  Discontinued  . COVID-19 Vaccine  Discontinued  . Pneumonia vaccines  Discontinued  *Topic was postponed. The date shown is not the original due date.    Immunizations There is no immunization history on file for this patient. Covid, Pneumococcal, Influenza, vaccine discontinued per patient preference.   Cpe 10/17/20 @ 9:00; fasting for labs  Advanced directives: End of life planning; Advance aging; Advanced directives discussed.  Copy of current HCPOA/Living Will requested.    Conditions/risks identified: none new.  Follow up in one year for your annual wellness visit.   Preventive Care 32 Years and Older, Female Preventive care refers to lifestyle choices and visits with your health care provider that can promote health and wellness. What does preventive care include?  A yearly physical exam. This is also called an annual well check.  Dental exams once or twice a year.  Routine eye exams. Ask your health care provider how often you should have your eyes checked.  Personal lifestyle choices, including:  Daily care of your teeth and gums.  Regular physical activity.  Eating a healthy diet.  Avoiding tobacco and drug  use.  Limiting alcohol use.  Practicing safe sex.  Taking low-dose aspirin every day.  Taking vitamin and mineral supplements as recommended by your health care provider. What happens during an annual well check? The services and screenings done by your health care provider during your annual well check will depend on your age, overall health, lifestyle risk factors, and family history of disease. Counseling  Your health care provider may ask you questions about your:  Alcohol use.  Tobacco use.  Drug use.  Emotional well-being.  Home and relationship well-being.  Sexual activity.  Eating habits.  History of falls.  Memory and ability to understand (cognition).  Work and work Statistician.  Reproductive health. Screening  You may have the following tests or measurements:  Height, weight, and BMI.  Blood pressure.  Lipid and cholesterol levels. These may be checked every 5 years, or more frequently if you are over 34 years old.  Skin check.  Lung cancer screening. You may have this screening every year starting at age 34 if you have a 30-pack-year history of smoking and currently smoke or have quit within the past 15 years.  Fecal occult blood test (FOBT) of the stool. You may have this test every year starting at age 62.  Flexible sigmoidoscopy or colonoscopy. You may have a sigmoidoscopy every 5 years or a colonoscopy every 10 years starting at age 69.  Hepatitis C blood test.  Hepatitis B blood test.  Sexually transmitted disease (STD) testing.  Diabetes screening. This is done by checking your blood sugar (glucose) after you have not eaten for a while (fasting). You may have this done every 1-3 years.  Bone density scan. This is done to screen for osteoporosis. You may have this done starting at age 71.  Mammogram. This may be done every 1-2 years. Talk to your health care provider about how often you should have regular mammograms. Talk with your  health care provider about your test results, treatment options, and if necessary, the need for more tests. Vaccines  Your health care provider may recommend certain vaccines, such as:  Influenza vaccine. This is recommended every year.  Tetanus, diphtheria, and acellular pertussis (Tdap, Td) vaccine. You may need a Td booster every 10 years.  Zoster vaccine. You may need this after age 75.  Pneumococcal 13-valent conjugate (PCV13) vaccine. One dose is recommended after age 36.  Pneumococcal polysaccharide (PPSV23) vaccine. One dose is recommended after age 84. Talk to your health care provider about which screenings and vaccines you need and how often you need them. This information is not intended to replace advice given to you by your health care provider. Make sure you discuss any questions you have with your health care provider. Document Released: 11/23/2015 Document Revised: 07/16/2016 Document Reviewed: 08/28/2015 Elsevier Interactive Patient Education  2017 [place] Prevention in the Home Falls can cause injuries. They can happen to people of all ages. There are many things you can do to make your home safe and to help prevent falls. What can I do on the outside of my home?  Regularly fix the edges of walkways and driveways and fix any cracks.  Remove anything that might make you trip as you walk through a door, such as a raised step or threshold.  Trim any bushes or trees on the path to your home.  Use bright outdoor lighting.  Clear any walking paths of anything that might make someone trip, such as rocks or tools.  Regularly check to see if handrails are loose or broken. Make sure that both sides of any steps have handrails.  Any raised decks and porches should have guardrails on the edges.  Have any leaves, snow, or ice cleared regularly.  Use sand or salt on walking paths during winter.  Clean up any spills in your garage right away. This includes oil  or grease spills. What can I do in the bathroom?  Use night lights.  Install grab bars by the toilet and in the tub and shower. Do not use towel bars as grab bars.  Use non-skid mats or decals in the tub or shower.  If you need to sit down in the shower, use a plastic, non-slip stool.  Keep the floor dry. Clean up any water that spills on the floor as soon as it happens.  Remove soap buildup in the tub or shower regularly.  Attach bath mats securely with double-sided non-slip rug tape.  Do not have throw rugs and other things on the floor that can make you trip. What can I do in the bedroom?  Use night lights.  Make sure that you have a light by your bed that is easy to reach.  Do not use any sheets or blankets that are too big for your bed. They should not hang down onto the floor.  Have a firm chair that has side arms. You can use this for support while you get  dressed.  Do not have throw rugs and other things on the floor that can make you trip. What can I do in the kitchen?  Clean up any spills right away.  Avoid walking on wet floors.  Keep items that you use a lot in easy-to-reach places.  If you need to reach something above you, use a strong step stool that has a grab bar.  Keep electrical cords out of the way.  Do not use floor polish or wax that makes floors slippery. If you must use wax, use non-skid floor wax.  Do not have throw rugs and other things on the floor that can make you trip. What can I do with my stairs?  Do not leave any items on the stairs.  Make sure that there are handrails on both sides of the stairs and use them. Fix handrails that are broken or loose. Make sure that handrails are as long as the stairways.  Check any carpeting to make sure that it is firmly attached to the stairs. Fix any carpet that is loose or worn.  Avoid having throw rugs at the top or bottom of the stairs. If you do have throw rugs, attach them to the floor with  carpet tape.  Make sure that you have a light switch at the top of the stairs and the bottom of the stairs. If you do not have them, ask someone to add them for you. What else can I do to help prevent falls?  Wear shoes that:  Do not have high heels.  Have rubber bottoms.  Are comfortable and fit you well.  Are closed at the toe. Do not wear sandals.  If you use a stepladder:  Make sure that it is fully opened. Do not climb a closed stepladder.  Make sure that both sides of the stepladder are locked into place.  Ask someone to hold it for you, if possible.  Clearly mark and make sure that you can see:  Any grab bars or handrails.  First and last steps.  Where the edge of each step is.  Use tools that help you move around (mobility aids) if they are needed. These include:  Canes.  Walkers.  Scooters.  Crutches.  Turn on the lights when you go into a dark area. Replace any light bulbs as soon as they burn out.  Set up your furniture so you have a clear path. Avoid moving your furniture around.  If any of your floors are uneven, fix them.  If there are any pets around you, be aware of where they are.  Review your medicines with your doctor. Some medicines can make you feel dizzy. This can increase your chance of falling. Ask your doctor what other things that you can do to help prevent falls. This information is not intended to replace advice given to you by your health care provider. Make sure you discuss any questions you have with your health care provider. Document Released: 08/23/2009 Document Revised: 04/03/2016 Document Reviewed: 12/01/2014 Elsevier Interactive Patient Education  2017 [employer].

## 2020-09-19 ENCOUNTER — Telehealth: Payer: Self-pay | Admitting: Internal Medicine

## 2020-09-19 NOTE — Telephone Encounter (Signed)
Patient is requesting to have a referral put in for [facility] to have her yearly mammogram.

## 2020-09-20 NOTE — Telephone Encounter (Signed)
Last mammogram done 10/2019, Please advise

## 2020-09-24 ENCOUNTER — Other Ambulatory Visit: Payer: Self-pay | Admitting: Internal Medicine

## 2020-09-24 DIAGNOSIS — Z1231 Encounter for screening mammogram for malignant neoplasm of breast: Secondary | ICD-10-CM

## 2020-09-24 NOTE — Telephone Encounter (Signed)
Order was already in and appears pt sch 10/31/20 [facility]   Inform pt   Thanks

## 2020-09-24 NOTE — Telephone Encounter (Signed)
Left message for Patient to call back in with any further questions

## 2020-10-17 ENCOUNTER — Other Ambulatory Visit: Payer: Self-pay

## 2020-10-17 ENCOUNTER — Encounter: Payer: Self-pay | Admitting: Internal Medicine

## 2020-10-17 ENCOUNTER — Ambulatory Visit (INDEPENDENT_AMBULATORY_CARE_PROVIDER_SITE_OTHER): Payer: Medicare Other | Admitting: Internal Medicine

## 2020-10-17 VITALS — BP 148/88 | HR 87 | Temp 98.2°F | Ht 64.0 in | Wt 168.8 lb

## 2020-10-17 DIAGNOSIS — Z1329 Encounter for screening for other suspected endocrine disorder: Secondary | ICD-10-CM | POA: Diagnosis not present

## 2020-10-17 DIAGNOSIS — R7303 Prediabetes: Secondary | ICD-10-CM

## 2020-10-17 DIAGNOSIS — J309 Allergic rhinitis, unspecified: Secondary | ICD-10-CM | POA: Diagnosis not present

## 2020-10-17 DIAGNOSIS — Z1389 Encounter for screening for other disorder: Secondary | ICD-10-CM

## 2020-10-17 DIAGNOSIS — I10 Essential (primary) hypertension: Secondary | ICD-10-CM | POA: Diagnosis not present

## 2020-10-17 DIAGNOSIS — R8290 Unspecified abnormal findings in urine: Secondary | ICD-10-CM | POA: Diagnosis not present

## 2020-10-17 DIAGNOSIS — R8271 Bacteriuria: Secondary | ICD-10-CM | POA: Diagnosis not present

## 2020-10-17 DIAGNOSIS — M47816 Spondylosis without myelopathy or radiculopathy, lumbar region: Secondary | ICD-10-CM | POA: Insufficient documentation

## 2020-10-17 LAB — CBC WITH DIFFERENTIAL/PLATELET
Basophils Absolute: 0.1 10*3/uL (ref 0.0–0.1)
Basophils Relative: 1 % (ref 0.0–3.0)
Eosinophils Absolute: 0.1 10*3/uL (ref 0.0–0.7)
Eosinophils Relative: 1.7 % (ref 0.0–5.0)
HCT: 45 % (ref 36.0–46.0)
Hemoglobin: 14.7 g/dL (ref 12.0–15.0)
Lymphocytes Relative: 25.1 % (ref 12.0–46.0)
Lymphs Abs: 1.5 10*3/uL (ref 0.7–4.0)
MCHC: 32.7 g/dL (ref 30.0–36.0)
MCV: 86.7 fl (ref 78.0–100.0)
Monocytes Absolute: 0.5 10*3/uL (ref 0.1–1.0)
Monocytes Relative: 7.4 % (ref 3.0–12.0)
Neutro Abs: 4 10*3/uL (ref 1.4–7.7)
Neutrophils Relative %: 64.8 % (ref 43.0–77.0)
Platelets: 316 10*3/uL (ref 150.0–400.0)
RBC: 5.19 Mil/uL — ABNORMAL HIGH (ref 3.87–5.11)
RDW: 13.3 % (ref 11.5–15.5)
WBC: 6.1 10*3/uL (ref 4.0–10.5)

## 2020-10-17 LAB — LIPID PANEL
Cholesterol: 243 mg/dL — ABNORMAL HIGH (ref 0–200)
HDL: 58.7 mg/dL (ref >39.00)
LDL Cholesterol: 153 mg/dL — ABNORMAL HIGH (ref 0–99)
NonHDL: 184.12
Total CHOL/HDL Ratio: 4
Triglycerides: 158 mg/dL — ABNORMAL HIGH (ref 0.0–149.0)
VLDL: 31.6 mg/dL (ref 0.0–40.0)

## 2020-10-17 LAB — HEMOGLOBIN A1C: Hgb A1c MFr Bld: 5.9 % (ref 4.6–6.5)

## 2020-10-17 LAB — COMPREHENSIVE METABOLIC PANEL
ALT: 18 U/L (ref 0–35)
AST: 15 U/L (ref 0–37)
Albumin: 4.5 g/dL (ref 3.5–5.2)
Alkaline Phosphatase: 74 U/L (ref 39–117)
BUN: 12 mg/dL (ref 6–23)
CO2: 33 mEq/L — ABNORMAL HIGH (ref 19–32)
Calcium: 10.4 mg/dL (ref 8.4–10.5)
Chloride: 102 mEq/L (ref 96–112)
Creatinine, Ser: 0.84 mg/dL (ref 0.40–1.20)
GFR: 70.92 mL/min (ref >60.00)
Glucose, Bld: 101 mg/dL — ABNORMAL HIGH (ref 70–99)
Potassium: 4.4 mEq/L (ref 3.5–5.1)
Sodium: 140 mEq/L (ref 135–145)
Total Bilirubin: 0.7 mg/dL (ref 0.2–1.2)
Total Protein: 7.4 g/dL (ref 6.0–8.3)

## 2020-10-17 LAB — TSH: TSH: 5.06 u[IU]/mL — ABNORMAL HIGH (ref 0.35–4.50)

## 2020-10-17 MED ORDER — AMLODIPINE BESYLATE 5 MG PO TABS: 2.5000 mg | ORAL_TABLET | Freq: Every day | ORAL | 3 refills | Status: DC

## 2020-10-17 MED ORDER — CHLORTHALIDONE 25 MG PO TABS: 12.5000 mg | ORAL_TABLET | Freq: Every day | ORAL | 3 refills | Status: DC

## 2020-10-17 NOTE — Progress Notes (Signed)
Chief Complaint  Patient presents with  . Follow-up   F/u 1.HTN norvasc 2.5 to 50 mg qd on chlorthalidone 12.5 mg qd    Review of Systems  Constitutional: Negative for weight loss.  HENT: Negative for hearing loss.   Eyes: Negative for blurred vision.  Respiratory: Negative for shortness of breath.   Cardiovascular: Negative for chest pain.  Gastrointestinal: Negative for abdominal pain.  Musculoskeletal: Negative for falls.  Skin: Negative for rash.  Neurological: Negative for headaches.  Psychiatric/Behavioral: Negative for depression.   Past Medical History:  Diagnosis Date  . Chicken pox   . Glaucoma    left eye [place] [facility]   . Hyperlipidemia   . Hypertension   . Vitamin D deficiency   . Wears glasses    Past Surgical History:  Procedure Laterality Date  . CESAREAN SECTION     x 1    Family History  Problem Relation Age of Onset  . Stroke Mother   . Hypertension Mother   . Diabetes Mother   . Cancer Father        prostate   . Hyperlipidemia Father   . Diabetes Father   . Hyperlipidemia Other        grandparents   . Breast cancer Neg Hx    Social History   Socioeconomic History  . Marital status: Married    Spouse name: Not on file  . Number of children: Not on file  . Years of education: Not on file  . Highest education level: Not on file  Occupational History  . Occupation: retired  Tobacco Use  . Smoking status: Never Smoker  . Smokeless tobacco: Never Used  Vaping Use  . Vaping Use: Never used  Substance and Sexual Activity  . Alcohol use: Yes    Comment: occasional  . Drug use: No  . Sexual activity: Not on file  Other Topics Concern  . Not on file  Social History Narrative   Retired (previous jobs Science writer, private investigation fraud, [employer]   Lived in [place] for a while with husband who is from there   Lived all over [place]. From [place]    1 son lives in [place] now but lived [place] prior with 1 grandson in [place] and 1  grandson in [place]. Wine denies cig/drugs    Likes to exercise    Sexually active with current husband since age 36 y.o    Family origin from [place]    Social Determinants of Health   Financial Resource Strain: Low Risk   . Difficulty of Paying Living Expenses: Not hard at all  Food Insecurity: No Food Insecurity  . Worried About Charity fundraiser in the Last Year: Never true  . Ran Out of Food in the Last Year: Never true  Transportation Needs: No Transportation Needs  . Lack of Transportation (Medical): No  . Lack of Transportation (Non-Medical): No  Physical Activity:   . Days of Exercise per Week: Not on file  . Minutes of Exercise per Session: Not on file  Stress: No Stress Concern Present  . Feeling of Stress : Not at all  Social Connections: Unknown  . Frequency of Communication with Friends and Family: More than three times a week  . Frequency of Social Gatherings with Friends and Family: More than three times a week  . Attends Religious Services: Not on file  . Active Member of Clubs or Organizations: Not on file  .  Attends Archivist Meetings: Not on file  . Marital Status: Married  Human resources officer Violence: Not At Risk  . Fear of Current or Ex-Partner: No  . Emotionally Abused: No  . Physically Abused: No  . Sexually Abused: No   Current Meds  Medication Sig  . amLODipine (NORVASC) 5 MG tablet Take 0.5-1 tablets (2.5-5 mg total) by mouth daily. Take 5 mg dose if Blood pressure >130/>80  . aspirin 81 MG EC tablet Take 81 mg by mouth every other day. Swallow whole.  . calcium-vitamin D (OSCAL WITH D) 500-200 MG-UNIT TABS tablet Take by mouth.  . chlorthalidone (HYGROTON) 25 MG tablet Take 0.5 tablets (12.5 mg total) by mouth daily. In the morning  . dorzolamide-timolol (COSOPT) 22.3-6.8 MG/ML ophthalmic solution Place 1 drop into the left eye 2 (two) times daily.  Marland Kitchen LATANOPROST OP Apply 1 drop to eye 1 day or 1 dose. B/l eyes 1x  . magnesium  30 MG tablet Take 30 mg daily by mouth.  . vitamin B-12 (CYANOCOBALAMIN) 100 MCG tablet Take 100 mcg daily by mouth.  . [DISCONTINUED] amLODipine (NORVASC) 5 MG tablet Take 0.5-1 tablets (2.5-5 mg total) by mouth daily. Take 5 mg dose if Blood pressure >130/>80  . [DISCONTINUED] chlorthalidone (HYGROTON) 25 MG tablet Take 0.5 tablets (12.5 mg total) by mouth daily. In the morning   Allergies  Allergen Reactions  . Lisinopril     Cough    No results found for this or any previous visit (from the past 2160 hour(s)). Objective  Body mass index is 28.97 kg/m. Wt Readings from Last 3 Encounters:  10/17/20 168 lb 12.8 oz (76.6 kg)  09/07/20 164 lb (74.4 kg)  09/29/19 177 lb (80.3 kg)   Temp Readings from Last 3 Encounters:  10/17/20 98.2 F (36.8 C) (Oral)  11/04/18 98.1 F (36.7 C) (Oral)  03/05/18 97.9 F (36.6 C) (Oral)   BP Readings from Last 3 Encounters:  10/17/20 (!) 148/88  09/29/19 125/72  11/04/18 138/82   Pulse Readings from Last 3 Encounters:  10/17/20 87  11/04/18 75  03/05/18 76    Physical Exam Vitals reviewed.  Constitutional:      Appearance: Normal appearance. She is well-developed, well-groomed and overweight.  HENT:     Head: Normocephalic and atraumatic.  Eyes:     Conjunctiva/sclera: Conjunctivae normal.     Pupils: Pupils are equal, round, and reactive to light.  Cardiovascular:     Rate and Rhythm: Normal rate and regular rhythm.     Heart sounds: Normal heart sounds. No murmur heard.   Pulmonary:     Effort: Pulmonary effort is normal.     Breath sounds: Normal breath sounds.  Abdominal:     General: Abdomen is flat. Bowel sounds are normal.  Skin:    General: Skin is warm and dry.  Neurological:     General: No focal deficit present.     Mental Status: She is alert and oriented to person, place, and time. Mental status is at baseline.     Gait: Gait normal.  Psychiatric:        Attention and Perception: Attention and perception  normal.        Mood and Affect: Mood and affect normal.        Speech: Speech normal.        Behavior: Behavior normal. Behavior is cooperative.        Thought Content: Thought content normal.  Cognition and Memory: Cognition and memory normal.        Judgment: Judgment normal.     Assessment  Plan  Allergic rhinitis, unspecified seasonality, unspecified trigger Disc otc meds  Arthritis of lumbar spine  Essential hypertension - Plan: chlorthalidone (HYGROTON) 25 MG tablet, amLODipine (NORVASC) 5 MG tablet, Comprehensive metabolic panel, Lipid panel, CBC with Differential/Platelet BP check at home  Goal <130/<80   Prediabetes - Plan: Hemoglobin A1c  HM Declines vaccines (shingrix, prevnar, flu, covid). -Tdap due 2020given Rx prev.   MMR immune  6/11/19cologaurd   mammo UTDneg 10/28/18 normalsch 10/31/20   Pap UTD12/13/18 negative pap neg HPV  dexa6/26/19 osteopenia rec calcium and vit D5000 IU D3 qd Consider repeat mammo in 2022     Eye MD appt will sch 05/2021  Dentist cancelled due pandemic 11/2020 at [place] no issues as of 10/17/20  Provider: Dr. Olivia Mackie McLean-Scocuzza-Internal Medicine

## 2020-10-17 NOTE — Patient Instructions (Addendum)
Nasal saline 2 sprays  Flonase as needed 2 sprays  Zyrtec/claritin/allegra or xyzal at night   Repeat cologuard due 04/20/21 call me 03/2021 and request this   Blood pressure goal <130/<80 let me know if higher    Allergic Rhinitis, Adult Allergic rhinitis is an allergic reaction that affects the mucous membrane inside the nose. It causes sneezing, a runny or stuffy nose, and the feeling of mucus going down the back of the throat (postnasal drip). Allergic rhinitis can be mild to severe. There are two types of allergic rhinitis:  Seasonal. This type is also called hay fever. It happens only during certain seasons.  Perennial. This type can happen at any time of the year. What are the causes? This condition happens when the body's defense system (immune system) responds to certain harmless substances called allergens as though they were germs.  Seasonal allergic rhinitis is triggered by pollen, which can come from grasses, trees, and weeds. Perennial allergic rhinitis may be caused by:  House dust mites.  Pet dander.  Mold spores. What are the signs or symptoms? Symptoms of this condition include:  Sneezing.  Runny or stuffy nose (nasal congestion).  Postnasal drip.  Itchy nose.  Tearing of the eyes.  Trouble sleeping.  Daytime sleepiness. How is this diagnosed? This condition may be diagnosed based on:  Your medical history.  A physical exam.  Tests to check for related conditions, such as: ? Asthma. ? Pink eye. ? Ear infection. ? Upper respiratory infection.  Tests to find out which allergens trigger your symptoms. These may include skin or blood tests. How is this treated? There is no cure for this condition, but treatment can help control symptoms. Treatment may include:  Taking medicines that block allergy symptoms, such as antihistamines. Medicine may be given as a shot, nasal spray, or pill.  Avoiding the allergen.  Desensitization. This treatment  involves getting ongoing shots until your body becomes less sensitive to the allergen. This treatment may be done if other treatments do not help.  If taking medicine and avoiding the allergen does not work, new, stronger medicines may be prescribed. Follow these instructions at home:  Find out what you are allergic to. Common allergens include smoke, dust, and pollen.  Avoid the things you are allergic to. These are some things you can do to help avoid allergens: ? Replace carpet with wood, tile, or vinyl flooring. Carpet can trap dander and dust. ? Do not smoke. Do not allow smoking in your home. ? Change your heating and air conditioning filter at least once a month. ? During allergy season:  Keep windows closed as much as possible.  Plan outdoor activities when pollen counts are lowest. This is usually during the evening hours.  When coming indoors, change clothing and shower before sitting on furniture or bedding.  Take over-the-counter and prescription medicines only as told by your health care provider.  Keep all follow-up visits as told by your health care provider. This is important. Contact a health care provider if:  You have a fever.  You develop a persistent cough.  You make whistling sounds when you breathe (you wheeze).  Your symptoms interfere with your normal daily activities. Get help right away if:  You have shortness of breath. Summary  This condition can be managed by taking medicines as directed and avoiding allergens.  Contact your health care provider if you develop a persistent cough or fever.  During allergy season, keep windows closed as much  as possible. This information is not intended to replace advice given to you by your health care provider. Make sure you discuss any questions you have with your health care provider. Document Revised: 10/09/2017 Document Reviewed: 12/04/2016 Elsevier Patient Education  2020 [employer].

## 2020-10-18 LAB — URINALYSIS, ROUTINE W REFLEX MICROSCOPIC
Bacteria, UA: NONE SEEN /HPF
Bilirubin Urine: NEGATIVE
Glucose, UA: NEGATIVE
Hgb urine dipstick: NEGATIVE
Hyaline Cast: NONE SEEN /LPF
Ketones, ur: NEGATIVE
Nitrite: NEGATIVE
Protein, ur: NEGATIVE
RBC / HPF: NONE SEEN /HPF (ref 0–2)
Specific Gravity, Urine: 1.003 (ref 1.001–1.03)
Squamous Epithelial / HPF: NONE SEEN /HPF (ref <=5)
WBC, UA: NONE SEEN /HPF (ref 0–5)
pH: 7 (ref 5.0–8.0)

## 2020-10-22 ENCOUNTER — Other Ambulatory Visit: Payer: Self-pay | Admitting: Internal Medicine

## 2020-10-22 DIAGNOSIS — R946 Abnormal results of thyroid function studies: Secondary | ICD-10-CM

## 2020-10-22 DIAGNOSIS — R7989 Other specified abnormal findings of blood chemistry: Secondary | ICD-10-CM

## 2020-10-24 DIAGNOSIS — M5459 Other low back pain: Secondary | ICD-10-CM | POA: Diagnosis not present

## 2020-10-24 DIAGNOSIS — M9902 Segmental and somatic dysfunction of thoracic region: Secondary | ICD-10-CM | POA: Diagnosis not present

## 2020-10-24 DIAGNOSIS — M9901 Segmental and somatic dysfunction of cervical region: Secondary | ICD-10-CM | POA: Diagnosis not present

## 2020-10-24 DIAGNOSIS — M9903 Segmental and somatic dysfunction of lumbar region: Secondary | ICD-10-CM | POA: Diagnosis not present

## 2020-10-25 DIAGNOSIS — M9902 Segmental and somatic dysfunction of thoracic region: Secondary | ICD-10-CM | POA: Diagnosis not present

## 2020-10-25 DIAGNOSIS — M5459 Other low back pain: Secondary | ICD-10-CM | POA: Diagnosis not present

## 2020-10-25 DIAGNOSIS — M9903 Segmental and somatic dysfunction of lumbar region: Secondary | ICD-10-CM | POA: Diagnosis not present

## 2020-10-25 DIAGNOSIS — M9901 Segmental and somatic dysfunction of cervical region: Secondary | ICD-10-CM | POA: Diagnosis not present

## 2020-10-29 DIAGNOSIS — M5459 Other low back pain: Secondary | ICD-10-CM | POA: Diagnosis not present

## 2020-10-29 DIAGNOSIS — M9901 Segmental and somatic dysfunction of cervical region: Secondary | ICD-10-CM | POA: Diagnosis not present

## 2020-10-29 DIAGNOSIS — M9903 Segmental and somatic dysfunction of lumbar region: Secondary | ICD-10-CM | POA: Diagnosis not present

## 2020-10-29 DIAGNOSIS — M9902 Segmental and somatic dysfunction of thoracic region: Secondary | ICD-10-CM | POA: Diagnosis not present

## 2020-10-30 DIAGNOSIS — M5459 Other low back pain: Secondary | ICD-10-CM | POA: Diagnosis not present

## 2020-10-30 DIAGNOSIS — M9902 Segmental and somatic dysfunction of thoracic region: Secondary | ICD-10-CM | POA: Diagnosis not present

## 2020-10-30 DIAGNOSIS — M9903 Segmental and somatic dysfunction of lumbar region: Secondary | ICD-10-CM | POA: Diagnosis not present

## 2020-10-30 DIAGNOSIS — M9901 Segmental and somatic dysfunction of cervical region: Secondary | ICD-10-CM | POA: Diagnosis not present

## 2020-10-31 ENCOUNTER — Ambulatory Visit
Admission: RE | Admit: 2020-10-31 | Discharge: 2020-10-31 | Disposition: A | Discharge status: Home / Self Care | Payer: Medicare Other | Source: Ambulatory Visit | Attending: Internal Medicine | Admitting: Internal Medicine

## 2020-10-31 ENCOUNTER — Other Ambulatory Visit: Payer: Self-pay

## 2020-10-31 DIAGNOSIS — Z1231 Encounter for screening mammogram for malignant neoplasm of breast: Secondary | ICD-10-CM | POA: Diagnosis not present

## 2020-11-05 DIAGNOSIS — M9902 Segmental and somatic dysfunction of thoracic region: Secondary | ICD-10-CM | POA: Diagnosis not present

## 2020-11-05 DIAGNOSIS — M5459 Other low back pain: Secondary | ICD-10-CM | POA: Diagnosis not present

## 2020-11-05 DIAGNOSIS — M9901 Segmental and somatic dysfunction of cervical region: Secondary | ICD-10-CM | POA: Diagnosis not present

## 2020-11-05 DIAGNOSIS — M9903 Segmental and somatic dysfunction of lumbar region: Secondary | ICD-10-CM | POA: Diagnosis not present

## 2020-11-06 DIAGNOSIS — M9902 Segmental and somatic dysfunction of thoracic region: Secondary | ICD-10-CM | POA: Diagnosis not present

## 2020-11-06 DIAGNOSIS — M9903 Segmental and somatic dysfunction of lumbar region: Secondary | ICD-10-CM | POA: Diagnosis not present

## 2020-11-06 DIAGNOSIS — M5459 Other low back pain: Secondary | ICD-10-CM | POA: Diagnosis not present

## 2020-11-06 DIAGNOSIS — M9901 Segmental and somatic dysfunction of cervical region: Secondary | ICD-10-CM | POA: Diagnosis not present

## 2020-11-19 DIAGNOSIS — M9903 Segmental and somatic dysfunction of lumbar region: Secondary | ICD-10-CM | POA: Diagnosis not present

## 2020-11-19 DIAGNOSIS — M9902 Segmental and somatic dysfunction of thoracic region: Secondary | ICD-10-CM | POA: Diagnosis not present

## 2020-11-19 DIAGNOSIS — M5459 Other low back pain: Secondary | ICD-10-CM | POA: Diagnosis not present

## 2020-11-19 DIAGNOSIS — M9901 Segmental and somatic dysfunction of cervical region: Secondary | ICD-10-CM | POA: Diagnosis not present

## 2021-01-18 DIAGNOSIS — M9903 Segmental and somatic dysfunction of lumbar region: Secondary | ICD-10-CM | POA: Diagnosis not present

## 2021-01-18 DIAGNOSIS — M5459 Other low back pain: Secondary | ICD-10-CM | POA: Diagnosis not present

## 2021-01-18 DIAGNOSIS — M9902 Segmental and somatic dysfunction of thoracic region: Secondary | ICD-10-CM | POA: Diagnosis not present

## 2021-01-18 DIAGNOSIS — M9901 Segmental and somatic dysfunction of cervical region: Secondary | ICD-10-CM | POA: Diagnosis not present

## 2021-01-23 DIAGNOSIS — M5459 Other low back pain: Secondary | ICD-10-CM | POA: Diagnosis not present

## 2021-01-23 DIAGNOSIS — M9902 Segmental and somatic dysfunction of thoracic region: Secondary | ICD-10-CM | POA: Diagnosis not present

## 2021-01-23 DIAGNOSIS — M9901 Segmental and somatic dysfunction of cervical region: Secondary | ICD-10-CM | POA: Diagnosis not present

## 2021-01-23 DIAGNOSIS — M9903 Segmental and somatic dysfunction of lumbar region: Secondary | ICD-10-CM | POA: Diagnosis not present

## 2021-01-25 DIAGNOSIS — M9901 Segmental and somatic dysfunction of cervical region: Secondary | ICD-10-CM | POA: Diagnosis not present

## 2021-01-25 DIAGNOSIS — M5459 Other low back pain: Secondary | ICD-10-CM | POA: Diagnosis not present

## 2021-01-25 DIAGNOSIS — M9902 Segmental and somatic dysfunction of thoracic region: Secondary | ICD-10-CM | POA: Diagnosis not present

## 2021-01-25 DIAGNOSIS — M9903 Segmental and somatic dysfunction of lumbar region: Secondary | ICD-10-CM | POA: Diagnosis not present

## 2021-04-17 ENCOUNTER — Ambulatory Visit (INDEPENDENT_AMBULATORY_CARE_PROVIDER_SITE_OTHER): Payer: Medicare Other | Admitting: Internal Medicine

## 2021-04-17 ENCOUNTER — Encounter: Payer: Self-pay | Admitting: Internal Medicine

## 2021-04-17 ENCOUNTER — Other Ambulatory Visit: Payer: Self-pay

## 2021-04-17 VITALS — BP 136/80 | HR 71 | Temp 98.4°F | Ht 64.0 in | Wt 163.3 lb

## 2021-04-17 DIAGNOSIS — R946 Abnormal results of thyroid function studies: Secondary | ICD-10-CM

## 2021-04-17 DIAGNOSIS — E2839 Other primary ovarian failure: Secondary | ICD-10-CM

## 2021-04-17 DIAGNOSIS — M8580 Other specified disorders of bone density and structure, unspecified site: Secondary | ICD-10-CM

## 2021-04-17 DIAGNOSIS — I10 Essential (primary) hypertension: Secondary | ICD-10-CM

## 2021-04-17 DIAGNOSIS — R7989 Other specified abnormal findings of blood chemistry: Secondary | ICD-10-CM

## 2021-04-17 DIAGNOSIS — R0982 Postnasal drip: Secondary | ICD-10-CM

## 2021-04-17 DIAGNOSIS — J309 Allergic rhinitis, unspecified: Secondary | ICD-10-CM | POA: Diagnosis not present

## 2021-04-17 DIAGNOSIS — Z1231 Encounter for screening mammogram for malignant neoplasm of breast: Secondary | ICD-10-CM

## 2021-04-17 LAB — COMPREHENSIVE METABOLIC PANEL
ALT: 19 U/L (ref 0–35)
AST: 14 U/L (ref 0–37)
Albumin: 4.5 g/dL (ref 3.5–5.2)
Alkaline Phosphatase: 73 U/L (ref 39–117)
BUN: 19 mg/dL (ref 6–23)
CO2: 30 mEq/L (ref 19–32)
Calcium: 9.9 mg/dL (ref 8.4–10.5)
Chloride: 102 mEq/L (ref 96–112)
Creatinine, Ser: 0.79 mg/dL (ref 0.40–1.20)
GFR: 76.07 mL/min (ref >60.00)
Glucose, Bld: 100 mg/dL — ABNORMAL HIGH (ref 70–99)
Potassium: 4.1 mEq/L (ref 3.5–5.1)
Sodium: 140 mEq/L (ref 135–145)
Total Bilirubin: 0.5 mg/dL (ref 0.2–1.2)
Total Protein: 8 g/dL (ref 6.0–8.3)

## 2021-04-17 LAB — TSH: TSH: 3.73 u[IU]/mL (ref 0.35–4.50)

## 2021-04-17 LAB — T3, FREE: T3, Free: 3.1 pg/mL (ref 2.3–4.2)

## 2021-04-17 LAB — T4, FREE: Free T4: 0.82 ng/dL (ref 0.60–1.60)

## 2021-04-17 MED ORDER — IPRATROPIUM BROMIDE 0.06 % NA SOLN: 2.0000 | Freq: Three times a day (TID) | NASAL | 12 refills | Status: AC | PRN

## 2021-04-17 MED ORDER — AMLODIPINE BESYLATE 2.5 MG PO TABS: 2.5000 mg | ORAL_TABLET | Freq: Two times a day (BID) | ORAL | 3 refills | Status: DC

## 2021-04-17 NOTE — Progress Notes (Signed)
Chief Complaint  Patient presents with  . Follow-up   Fu 1 htn on norvasc 2.5 mg qd stopped chlorthalidone 12.5 mg qd did not feel right and was taking it as needed BP sl elevated today  2. Abnormal tfts will repeat labs today  3. C/o PND tried netty pot and did not help    Review of Systems  Constitutional: Negative for weight loss.  HENT: Negative for hearing loss.   Eyes: Negative for blurred vision.  Respiratory: Negative for shortness of breath.   Cardiovascular: Negative for chest pain.  Skin: Negative for rash.  Endo/Heme/Allergies: Positive for environmental allergies.   Past Medical History:  Diagnosis Date  . Chicken pox   . Glaucoma    left eye [place] [facility]   . Hyperlipidemia   . Hypertension   . Vitamin D deficiency   . Wears glasses    Past Surgical History:  Procedure Laterality Date  . CESAREAN SECTION     x 1    Family History  Problem Relation Age of Onset  . Stroke Mother   . Hypertension Mother   . Diabetes Mother   . Cancer Father        prostate   . Hyperlipidemia Father   . Diabetes Father   . Hyperlipidemia Other        grandparents   . Breast cancer Neg Hx    Social History   Socioeconomic History  . Marital status: Married    Spouse name: Not on file  . Number of children: Not on file  . Years of education: Not on file  . Highest education level: Not on file  Occupational History  . Occupation: retired  Tobacco Use  . Smoking status: Never Smoker  . Smokeless tobacco: Never Used  Vaping Use  . Vaping Use: Never used  Substance and Sexual Activity  . Alcohol use: Yes    Comment: occasional  . Drug use: No  . Sexual activity: Not on file  Other Topics Concern  . Not on file  Social History Narrative   Retired (previous jobs Science writer, private investigation fraud, [employer]   Lived in [place] for a while with husband who is from there   Lived all over [place]. From [place]    1 son lives in [place] now but lived [place]  prior with 1 grandson in [place] and 1 grandson in [place]. Wine denies cig/drugs    Likes to exercise    Sexually active with current husband since age 52 y.o    Family origin from [place]    Social Determinants of Health   Financial Resource Strain: Low Risk   . Difficulty of Paying Living Expenses: Not hard at all  Food Insecurity: No Food Insecurity  . Worried About Charity fundraiser in the Last Year: Never true  . Ran Out of Food in the Last Year: Never true  Transportation Needs: No Transportation Needs  . Lack of Transportation (Medical): No  . Lack of Transportation (Non-Medical): No  Physical Activity: Not on file  Stress: No Stress Concern Present  . Feeling of Stress : Not at all  Social Connections: Unknown  . Frequency of Communication with Friends and Family: More than three times a week  . Frequency of Social Gatherings with Friends and Family: More than three times a week  . Attends Religious Services: Not on file  . Active Member of Clubs or Organizations: Not on file  .  Attends Archivist Meetings: Not on file  . Marital Status: Married  Human resources officer Violence: Not At Risk  . Fear of Current or Ex-Partner: No  . Emotionally Abused: No  . Physically Abused: No  . Sexually Abused: No   Current Meds  Medication Sig  . amLODipine (NORVASC) 2.5 MG tablet Take 1 tablet (2.5 mg total) by mouth in the morning and at bedtime.  . Ascorbic Acid (VITAMIN C PO) Take by mouth.  . calcium-vitamin D (OSCAL WITH D) 500-200 MG-UNIT TABS tablet Take by mouth.  . dorzolamide-timolol (COSOPT) 22.3-6.8 MG/ML ophthalmic solution Place 1 drop into the left eye 2 (two) times daily.  Marland Kitchen ipratropium (ATROVENT) 0.06 % nasal spray Place 2 sprays into both nostrils 3 (three) times daily as needed for rhinitis.  Marland Kitchen LATANOPROST OP Apply 1 drop to eye 1 day or 1 dose. B/l eyes 1x  . magnesium 30 MG tablet Take 30 mg daily by mouth.  . vitamin B-12 (CYANOCOBALAMIN) 100 MCG  tablet Take 100 mcg daily by mouth.  . zinc gluconate 50 MG tablet   . [DISCONTINUED] amLODipine (NORVASC) 5 MG tablet Take 0.5-1 tablets (2.5-5 mg total) by mouth daily. Take 5 mg dose if Blood pressure >130/>80  . [DISCONTINUED] aspirin 81 MG EC tablet Take 81 mg by mouth every other day. Swallow whole.  . [DISCONTINUED] chlorthalidone (HYGROTON) 25 MG tablet Take 0.5 tablets (12.5 mg total) by mouth daily. In the morning   Allergies  Allergen Reactions  . Lisinopril     Cough    No results found for this or any previous visit (from the past 2160 hour(s)). Objective  Body mass index is 28.03 kg/m. Wt Readings from Last 3 Encounters:  04/17/21 163 lb 4.8 oz (74.1 kg)  10/17/20 168 lb 12.8 oz (76.6 kg)  09/07/20 164 lb (74.4 kg)   Temp Readings from Last 3 Encounters:  04/17/21 98.4 F (36.9 C) (Oral)  10/17/20 98.2 F (36.8 C) (Oral)  11/04/18 98.1 F (36.7 C) (Oral)   BP Readings from Last 3 Encounters:  04/17/21 136/80  10/17/20 (!) 148/88  09/29/19 125/72   Pulse Readings from Last 3 Encounters:  04/17/21 71  10/17/20 87  11/04/18 75    Physical Exam Vitals and nursing note reviewed.  Constitutional:      Appearance: Normal appearance. She is well-developed, well-groomed and overweight.  HENT:     Head: Normocephalic and atraumatic.  Eyes:     Conjunctiva/sclera: Conjunctivae normal.     Pupils: Pupils are equal, round, and reactive to light.  Cardiovascular:     Rate and Rhythm: Normal rate and regular rhythm.     Heart sounds: Normal heart sounds. No murmur heard.   Pulmonary:     Effort: Pulmonary effort is normal.     Breath sounds: Normal breath sounds.  Abdominal:     General: Abdomen is flat. Bowel sounds are normal.  Skin:    General: Skin is warm and moist.  Neurological:     General: No focal deficit present.     Mental Status: She is alert and oriented to person, place, and time. Mental status is at baseline.     Gait: Gait normal.   Psychiatric:        Attention and Perception: Attention and perception normal.        Mood and Affect: Mood and affect normal.        Speech: Speech normal.        Behavior:  Behavior normal. Behavior is cooperative.        Thought Content: Thought content normal.        Cognition and Memory: Cognition and memory normal.        Judgment: Judgment normal.     Assessment  Plan  Essential hypertension - Plan: amLODipine (NORVASC) 2.5 MG tabletbid, Comprehensive metabolic panel, Comprehensive metabolic panel Wants to stop chlorthalidone 12.5 mg qd not taking daily   Abnormal thyroid blood test - Plan: T3, free, Thyroid peroxidase antibody, T4, free, TSH Elevated TSH - Plan: T3, free, Thyroid peroxidase antibody, T4, free, TSH Abnormal thyroid function test - Plan: T3, free, Thyroid peroxidase antibody, T4, free, TSH -consider thyroid US   PND (post-nasal drip) - Plan: ipratropium (ATROVENT) 0.06 % nasal spray Allergic rhinitis, unspecified seasonality, unspecified trigger - Plan: ipratropium (ATROVENT) 0.06 % nasal spray  Claritin, allegra, zyrtec or xyzal over the counter   HM Declines vaccines (shingrix, prevnar, flu, covid). -Tdap consider rx given today 04/17/21   MMR immune  6/11/19cologaurd ordered   mammo UTDneg 10/28/18 normalsch 10/31/20  ordered   Pap UTD12/13/18 negative pap neg HPV  dexa6/26/19 osteopenia rec calcium and vit ordered   D5000 IU D3 qd Consider repeat mammo in 2022    Eye MD appt will sch 05/2021  Dentist cancelled due pandemic 11/2020 at [facility] no issues as of 10/17/20  Provider: Dr. Olivia Mackie McLean-Scocuzza-Internal Medicine

## 2021-04-17 NOTE — Patient Instructions (Addendum)
Claritin, allegra, zyrtec or xyzal over the counter  Nasal saline then atrovent  BP med change norvasc take 2.5 mg 2x per day  cologuard please do it on a Monday  10/2021 mammogram and bone density repeat    DASH Eating Plan DASH stands for Dietary Approaches to Stop Hypertension. The DASH eating plan is a healthy eating plan that has been shown to:  Reduce high blood pressure (hypertension).  Reduce your risk for type 2 diabetes, heart disease, and stroke.  Help with weight loss. What are tips for following this plan? Reading food labels  Check food labels for the amount of salt (sodium) per serving. Choose foods with less than 5 percent of the Daily Value of sodium. Generally, foods with less than 300 milligrams (mg) of sodium per serving fit into this eating plan.  To find whole grains, look for the word "whole" as the first word in the ingredient list. Shopping  Buy products labeled as "low-sodium" or "no salt added."  Buy fresh foods. Avoid canned foods and pre-made or frozen meals. Cooking  Avoid adding salt when cooking. Use salt-free seasonings or herbs instead of table salt or sea salt. Check with your health care provider or pharmacist before using salt substitutes.  Do not fry foods. Cook foods using healthy methods such as baking, boiling, grilling, roasting, and broiling instead.  Cook with heart-healthy oils, such as olive, canola, avocado, soybean, or sunflower oil. Meal planning  Eat a balanced diet that includes: ? 4 or more servings of fruits and 4 or more servings of vegetables each day. Try to fill one-half of your plate with fruits and vegetables. ? 6-8 servings of whole grains each day. ? Less than 6 oz (170 g) of lean meat, poultry, or fish each day. A 3-oz (85-g) serving of meat is about the same size as a deck of cards. One egg equals 1 oz (28 g). ? 2-3 servings of low-fat dairy each day. One serving is 1 cup (237 mL). ? 1 serving of nuts, seeds, or  beans 5 times each week. ? 2-3 servings of heart-healthy fats. Healthy fats called omega-3 fatty acids are found in foods such as walnuts, flaxseeds, fortified milks, and eggs. These fats are also found in cold-water fish, such as sardines, salmon, and mackerel.  Limit how much you eat of: ? Canned or prepackaged foods. ? Food that is high in trans fat, such as some fried foods. ? Food that is high in saturated fat, such as fatty meat. ? Desserts and other sweets, sugary drinks, and other foods with added sugar. ? Full-fat dairy products.  Do not salt foods before eating.  Do not eat more than 4 egg yolks a week.  Try to eat at least 2 vegetarian meals a week.  Eat more home-cooked food and less restaurant, buffet, and fast food.   Lifestyle  When eating at a restaurant, ask that your food be prepared with less salt or no salt, if possible.  If you drink alcohol: ? Limit how much you use to:  0-1 drink a day for women who are not pregnant.  0-2 drinks a day for men. ? Be aware of how much alcohol is in your drink. In the [place] one drink equals one 12 oz bottle of beer (355 mL), one 5 oz glass of wine (148 mL), or one 1 oz glass of hard liquor (44 mL). General information  Avoid eating more than 2,300 mg of salt a day. If  you have hypertension, you may need to reduce your sodium intake to 1,500 mg a day.  Work with your health care provider to maintain a healthy body weight or to lose weight. Ask what an ideal weight is for you.  Get at least 30 minutes of exercise that causes your heart to beat faster (aerobic exercise) most days of the week. Activities may include walking, swimming, or biking.  Work with your health care provider or dietitian to adjust your eating plan to your individual calorie needs. What foods should I eat? Fruits All fresh, dried, or frozen fruit. Canned fruit in natural juice (without added sugar). Vegetables Fresh or frozen vegetables (raw, steamed,  roasted, or grilled). Low-sodium or reduced-sodium tomato and vegetable juice. Low-sodium or reduced-sodium tomato sauce and tomato paste. Low-sodium or reduced-sodium canned vegetables. Grains Whole-grain or whole-wheat bread. Whole-grain or whole-wheat pasta. Brown rice. [place]. Bulgur. Whole-grain and low-sodium cereals. Pita bread. Low-fat, low-sodium crackers. Whole-wheat flour tortillas. Meats and other proteins Skinless chicken or [place]. Ground chicken or [place]. Pork with fat trimmed off. Fish and seafood. Egg whites. Dried beans, peas, or lentils. Unsalted nuts, nut butters, and seeds. Unsalted canned beans. Lean cuts of beef with fat trimmed off. Low-sodium, lean precooked or cured meat, such as sausages or meat loaves. Dairy Low-fat (1%) or fat-free (skim) milk. Reduced-fat, low-fat, or fat-free cheeses. Nonfat, low-sodium ricotta or cottage cheese. Low-fat or nonfat yogurt. Low-fat, low-sodium cheese. Fats and oils Soft margarine without trans fats. Vegetable oil. Reduced-fat, low-fat, or light mayonnaise and salad dressings (reduced-sodium). Canola, safflower, olive, avocado, soybean, and sunflower oils. Avocado. Seasonings and condiments Herbs. Spices. Seasoning mixes without salt. Other foods Unsalted popcorn and pretzels. Fat-free sweets. The items listed above may not be a complete list of foods and beverages you can eat. Contact a dietitian for more information. What foods should I avoid? Fruits Canned fruit in a light or heavy syrup. Fried fruit. Fruit in cream or butter sauce. Vegetables Creamed or fried vegetables. Vegetables in a cheese sauce. Regular canned vegetables (not low-sodium or reduced-sodium). Regular canned tomato sauce and paste (not low-sodium or reduced-sodium). Regular tomato and vegetable juice (not low-sodium or reduced-sodium). Angie Fava. Olives. Grains Baked goods made with fat, such as croissants, muffins, or some breads. Dry pasta or rice meal  packs. Meats and other proteins Fatty cuts of meat. Ribs. Fried meat. Berniece Salines. Bologna, salami, and other precooked or cured meats, such as sausages or meat loaves. Fat from the back of a pig (fatback). Bratwurst. Salted nuts and seeds. Canned beans with added salt. Canned or smoked fish. Whole eggs or egg yolks. Chicken or [place] with skin. Dairy Whole or 2% milk, cream, and half-and-half. Whole or full-fat cream cheese. Whole-fat or sweetened yogurt. Full-fat cheese. Nondairy creamers. Whipped toppings. Processed cheese and cheese spreads. Fats and oils Butter. Stick margarine. Lard. Shortening. Ghee. Bacon fat. Tropical oils, such as coconut, palm kernel, or palm oil. Seasonings and condiments Onion salt, garlic salt, seasoned salt, table salt, and sea salt. Worcestershire sauce. Tartar sauce. Barbecue sauce. Teriyaki sauce. Soy sauce, including reduced-sodium. Steak sauce. Canned and packaged gravies. Fish sauce. Oyster sauce. Cocktail sauce. Store-bought horseradish. Ketchup. Mustard. Meat flavorings and tenderizers. Bouillon cubes. Hot sauces. Pre-made or packaged marinades. Pre-made or packaged taco seasonings. Relishes. Regular salad dressings. Other foods Salted popcorn and pretzels. The items listed above may not be a complete list of foods and beverages you should avoid. Contact a dietitian for more information. Where to find more information  National Heart, Lung, and Blood Institute: https://wilson-eaton.com/  American Heart Association: www.heart.org  Academy of Nutrition and Dietetics: www.eatright.[place]: www.kidney.org Summary  The DASH eating plan is a healthy eating plan that has been shown to reduce high blood pressure (hypertension). It may also reduce your risk for type 2 diabetes, heart disease, and stroke.  When on the DASH eating plan, aim to eat more fresh fruits and vegetables, whole grains, lean proteins, low-fat dairy, and heart-healthy  fats.  With the DASH eating plan, you should limit salt (sodium) intake to 2,300 mg a day. If you have hypertension, you may need to reduce your sodium intake to 1,500 mg a day.  Work with your health care provider or dietitian to adjust your eating plan to your individual calorie needs. This information is not intended to replace advice given to you by your health care provider. Make sure you discuss any questions you have with your health care provider. Document Revised: 09/30/2019 Document Reviewed: 09/30/2019 Elsevier Patient Education  2021 Elsevier Inc.     Postnasal Drip Postnasal drip is the feeling of mucus going down the back of your throat. Mucus is a slimy substance that moistens and cleans your nose and throat, as well as the air pockets in face bones near your forehead and cheeks (sinuses). Small amounts of mucus pass from your nose and sinuses down the back of your throat all the time. This is normal. When you produce too much mucus or the mucus gets too thick, you can feel it. Some common causes of postnasal drip include:  Having more mucus because of: ? A cold or the flu. ? Allergies. ? Cold air. ? Certain medicines.  Having more mucus that is thicker because of: ? A sinus or nasal infection. ? Dry air. ? A food allergy. Follow these instructions at home: Relieving discomfort  Gargle with a salt-water mixture 3-4 times a day or as needed. To make a salt-water mixture, completely dissolve -1 tsp of salt in 1 cup of warm water.  If the air in your home is dry, use a humidifier to add moisture to the air.  Use a saline spray or container (neti pot) to flush out the nose (nasal irrigation). These methods can help clear away mucus and keep the nasal passages moist.   General instructions  Take over-the-counter and prescription medicines only as told by your health care provider.  Follow instructions from your health care provider about eating or drinking  restrictions. You may need to avoid caffeine.  Avoid things that you know you are allergic to (allergens), like dust, mold, pollen, pets, or certain foods.  Drink enough fluid to keep your urine pale yellow.  Keep all follow-up visits as told by your health care provider. This is important. Contact a health care provider if:  You have a fever.  You have a sore throat.  You have difficulty swallowing.  You have headache.  You have sinus pain.  You have a cough that does not go away.  The mucus from your nose becomes thick and is green or yellow in color.  You have cold or flu symptoms that last more than 10 days. Summary  Postnasal drip is the feeling of mucus going down the back of your throat.  If your health care provider approves, use nasal irrigation or a nasal spray 2?4 times a day.  Avoid things that you know you are allergic to (allergens), like dust, mold, pollen, pets, or  certain foods. This information is not intended to replace advice given to you by your health care provider. Make sure you discuss any questions you have with your health care provider. Document Revised: 08/07/2020 Document Reviewed: 08/07/2020 Elsevier Patient Education  2021 [employer].

## 2021-04-19 LAB — THYROID PEROXIDASE ANTIBODY: Thyroperoxidase Ab SerPl-aCnc: 1 IU/mL (ref <9)

## 2021-05-15 ENCOUNTER — Telehealth: Payer: Self-pay | Admitting: Internal Medicine

## 2021-05-15 NOTE — Telephone Encounter (Signed)
Cologuard order placed for Patient via online portal.

## 2021-06-12 DIAGNOSIS — M9901 Segmental and somatic dysfunction of cervical region: Secondary | ICD-10-CM | POA: Diagnosis not present

## 2021-06-12 DIAGNOSIS — M9902 Segmental and somatic dysfunction of thoracic region: Secondary | ICD-10-CM | POA: Diagnosis not present

## 2021-06-12 DIAGNOSIS — M5459 Other low back pain: Secondary | ICD-10-CM | POA: Diagnosis not present

## 2021-06-12 DIAGNOSIS — M9903 Segmental and somatic dysfunction of lumbar region: Secondary | ICD-10-CM | POA: Diagnosis not present

## 2021-06-17 DIAGNOSIS — Z1211 Encounter for screening for malignant neoplasm of colon: Secondary | ICD-10-CM | POA: Diagnosis not present

## 2021-06-17 DIAGNOSIS — Z1212 Encounter for screening for malignant neoplasm of rectum: Secondary | ICD-10-CM | POA: Diagnosis not present

## 2021-06-18 DIAGNOSIS — M9901 Segmental and somatic dysfunction of cervical region: Secondary | ICD-10-CM | POA: Diagnosis not present

## 2021-06-18 DIAGNOSIS — M5459 Other low back pain: Secondary | ICD-10-CM | POA: Diagnosis not present

## 2021-06-18 DIAGNOSIS — M9903 Segmental and somatic dysfunction of lumbar region: Secondary | ICD-10-CM | POA: Diagnosis not present

## 2021-06-18 DIAGNOSIS — M9902 Segmental and somatic dysfunction of thoracic region: Secondary | ICD-10-CM | POA: Diagnosis not present

## 2021-06-20 DIAGNOSIS — M9902 Segmental and somatic dysfunction of thoracic region: Secondary | ICD-10-CM | POA: Diagnosis not present

## 2021-06-20 DIAGNOSIS — M5459 Other low back pain: Secondary | ICD-10-CM | POA: Diagnosis not present

## 2021-06-20 DIAGNOSIS — M9901 Segmental and somatic dysfunction of cervical region: Secondary | ICD-10-CM | POA: Diagnosis not present

## 2021-06-20 DIAGNOSIS — M9903 Segmental and somatic dysfunction of lumbar region: Secondary | ICD-10-CM | POA: Diagnosis not present

## 2021-06-26 ENCOUNTER — Encounter: Payer: Self-pay | Admitting: Internal Medicine

## 2021-06-26 LAB — COLOGUARD

## 2021-07-02 ENCOUNTER — Telehealth: Payer: Self-pay | Admitting: Internal Medicine

## 2021-07-02 DIAGNOSIS — R195 Other fecal abnormalities: Secondary | ICD-10-CM

## 2021-07-02 NOTE — Telephone Encounter (Signed)
Please advise 

## 2021-07-02 NOTE — Telephone Encounter (Signed)
ORDER INFORMATION Patient: GURSIMRAN, LITAKER Date of Birth: 1951-08-15 Medical Record #:   Sex: Female Authorizing Provider: Ginger Organ McLean-Scocuzza, MD Client Order ID:  Report Date: 06/26/2021 Specimen ID: [id number] Specimen Collected: 06/17/2021 11:00 AM Specimen Received: 06/17/2021 Specimen Type: Stool Specimen Source: Per Rectum       Contains abnormal data Cologuard Order: [id number] Status: Final result   0 Result Notes                Ref Range & Units 06/17/21 11:00 AM Test Result Negative PositiveAbnormal Comment: POSITIVE TEST RESULT. A positive Cologuard result should be followed with a colonoscopy or visual examination of the colon. The normal value (reference range) for this assay is negative.  TEST DESCRIPTION: Composite algorithmic analysis of stool DNA-biomarkers with hemoglobin immunoassay.   Quantitative values of individual biomarkers are not reportable and are not associated with individual biomarker result reference ranges. Cologuard is intended for colorectal cancer screening of adults of either sex, 47 years or older, who are at average-risk for colorectal cancer (CRC). Cologuard has been approved for use by the [place]. FDA. The performance of Cologuard was established in a cross sectional study of average-risk adults aged 31-84. Cologuard performance in patients ages 44 to 98 years was estimated by sub-group analysis of near-age groups. Colonoscopies performed for a positive result may find as the most clinically significant lesion: colorectal cancer [4.0%], advanced adenoma (including sessile serrated polyps greater than or equal to 1cm diameter) [20%] or non- advanced adenoma [31%]; or no colorectal neoplasia [45%]. These estimates are derived from a prospective cross-sectional screening study of 10,000 individuals at average risk for colorectal cancer who were screened with both Cologuard and colonoscopy. (Imperiale T. et al, Alison Stalling J Med 2014;370(14):1286-1297.)  Cologuard may produce a false negative or false positive result (no colorectal cancer or precancerous polyp present at colonoscopy follow up). A negative Cologuard test result does not guarantee the absence of CRC or advanced adenoma (pre-cancer). The current Cologuard screening interval is every 3 years. 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 Cologuard performance data in a 10,000 patient pivotal study using colonoscopy as the reference method can be accessed at the following location: www.exactlabs.com/results. Additional description of the Cologuard test process, warnings and precautions can be found at www.cologuard.com.        Specimen Collected: 06/17/21 11:00 AM Last Resulted: 06/26/21  5:53 PM      Result Care Coordination     Patient Communication   Released to patient Released Seen Seen          Questions    Order Question Answer Is this patient self-pay?  No Patient MRN  Client Order ID  Has the Patient consented to receive calls or text messages from [employer] concerning general CRC screening updates, reminders to screen again for CRC, and other healthcare and general account information?  No Collection Question Answer Is the Cologuard kit going to be distributed onsite?  Please scan the Cologuard Kit ID into this field      Resulting Lab    ------- [facility] (CLIA #:[id number]) [address] [place] [address] Lab Director: Guy Begin, Ph.D., [facility]     Cologuard is a registered trademark of [employer]   2019 [place]. All rights reserved.

## 2021-07-02 NOTE — Telephone Encounter (Signed)
PT called to advise that she would like to have another Cologuard done.

## 2021-07-03 NOTE — Telephone Encounter (Signed)
Please notify Diana Monroe that if her cologuard is positive, she needs to be referred to GI for evaluation and further w/up.  If agreeable, will need referral and need to know which GI MD she prefers to see.

## 2021-07-03 NOTE — Telephone Encounter (Signed)
Called to speak with Diana Monroe and discuss the issues she has with the cologuard. Pt states that she was worried about if her insurance covered the cost for the cologuard, I gave the number to the [employer]. Pt also worried if the colonoscopy was needed as it could be a possible hemorrhoid. Explained to the patient that this was just a screening and that it came back positive and a colonoscopy was necessary as a further follow up. Pt was understanding and is awaiting the doctors decision.

## 2021-07-03 NOTE — Telephone Encounter (Signed)
Diana Monroe, see me about this.  Is she wanting to do another cologuard?  Any problems?

## 2021-07-05 NOTE — Telephone Encounter (Signed)
Called and spoke with Nunzio Cory. She states that she would like to see Dr. Alice Reichert at [facility]

## 2021-07-05 NOTE — Addendum Note (Signed)
Addended by: Alisa Graff on: 07/05/2021 01:20 PM   Modules accepted: Orders

## 2021-07-05 NOTE — Telephone Encounter (Signed)
Order placed for GI referral - Dr Alice Reichert (per pt request)

## 2021-07-09 DIAGNOSIS — M9903 Segmental and somatic dysfunction of lumbar region: Secondary | ICD-10-CM | POA: Diagnosis not present

## 2021-07-09 DIAGNOSIS — M9902 Segmental and somatic dysfunction of thoracic region: Secondary | ICD-10-CM | POA: Diagnosis not present

## 2021-07-09 DIAGNOSIS — M5459 Other low back pain: Secondary | ICD-10-CM | POA: Diagnosis not present

## 2021-07-09 DIAGNOSIS — M9901 Segmental and somatic dysfunction of cervical region: Secondary | ICD-10-CM | POA: Diagnosis not present

## 2021-07-16 DIAGNOSIS — M9903 Segmental and somatic dysfunction of lumbar region: Secondary | ICD-10-CM | POA: Diagnosis not present

## 2021-07-16 DIAGNOSIS — M9902 Segmental and somatic dysfunction of thoracic region: Secondary | ICD-10-CM | POA: Diagnosis not present

## 2021-07-16 DIAGNOSIS — M5459 Other low back pain: Secondary | ICD-10-CM | POA: Diagnosis not present

## 2021-07-16 DIAGNOSIS — M9901 Segmental and somatic dysfunction of cervical region: Secondary | ICD-10-CM | POA: Diagnosis not present

## 2021-07-18 DIAGNOSIS — M9901 Segmental and somatic dysfunction of cervical region: Secondary | ICD-10-CM | POA: Diagnosis not present

## 2021-07-18 DIAGNOSIS — M9903 Segmental and somatic dysfunction of lumbar region: Secondary | ICD-10-CM | POA: Diagnosis not present

## 2021-07-18 DIAGNOSIS — M5459 Other low back pain: Secondary | ICD-10-CM | POA: Diagnosis not present

## 2021-07-18 DIAGNOSIS — M9902 Segmental and somatic dysfunction of thoracic region: Secondary | ICD-10-CM | POA: Diagnosis not present

## 2021-09-10 ENCOUNTER — Ambulatory Visit (INDEPENDENT_AMBULATORY_CARE_PROVIDER_SITE_OTHER): Payer: Medicare Other

## 2021-09-10 VITALS — Ht 64.0 in | Wt 163.0 lb

## 2021-09-10 DIAGNOSIS — Z0000 Encounter for general adult medical examination without abnormal findings: Secondary | ICD-10-CM | POA: Diagnosis not present

## 2021-09-10 NOTE — Progress Notes (Signed)
Subjective:   Diana Monroe is a 70 y.o. female who presents for Medicare Annual (Subsequent) preventive examination.  Review of Systems    No ROS Cardiac Risk Factors include: hypertension     Objective:    Today's Vitals   09/10/21 1117  Weight: 163 lb (73.9 kg)  Height: 5\' 4"  (1.626 m)   Body mass index is 27.98 kg/m.  Advanced Directives 09/10/2021 09/07/2020 09/07/2019  Does Patient Have a Medical Advance Directive? No Yes No  Type of Advance Directive - [place];Living will -  Does patient want to make changes to medical advance directive? - No - Patient declined -  Copy of [place] in Chart? - No - copy requested -  Would patient like information on creating a medical advance directive? No - Patient declined - No - Patient declined    Current Medications (verified) Outpatient Encounter Medications as of 09/10/2021  Medication Sig   amLODipine (NORVASC) 2.5 MG tablet Take 1 tablet (2.5 mg total) by mouth in the morning and at bedtime.   Ascorbic Acid (VITAMIN C PO) Take by mouth.   calcium-vitamin D (OSCAL WITH D) 500-200 MG-UNIT TABS tablet Take by mouth.   dorzolamide-timolol (COSOPT) 22.3-6.8 MG/ML ophthalmic solution Place 1 drop into the left eye 2 (two) times daily.   ipratropium (ATROVENT) 0.06 % nasal spray Place 2 sprays into both nostrils 3 (three) times daily as needed for rhinitis.   LATANOPROST OP Apply 1 drop to eye 1 day or 1 dose. B/l eyes 1x   magnesium 30 MG tablet Take 30 mg daily by mouth.   vitamin B-12 (CYANOCOBALAMIN) 100 MCG tablet Take 100 mcg daily by mouth.   zinc gluconate 50 MG tablet    No facility-administered encounter medications on file as of 09/10/2021.    Allergies (verified) Lisinopril   History: Past Medical History:  Diagnosis Date   Chicken pox    Glaucoma    left eye [place] [facility]    Hyperlipidemia    Hypertension    Vitamin D deficiency    Wears glasses    Past Surgical  History:  Procedure Laterality Date   CESAREAN SECTION     x 1    Family History  Problem Relation Age of Onset   Stroke Mother    Hypertension Mother    Diabetes Mother    Cancer Father        prostate    Hyperlipidemia Father    Diabetes Father    Hyperlipidemia Other        grandparents    Breast cancer Neg Hx    Social History   Socioeconomic History   Marital status: Married    Spouse name: Not on file   Number of children: Not on file   Years of education: Not on file   Highest education level: Not on file  Occupational History   Occupation: retired  Tobacco Use   Smoking status: Never   Smokeless tobacco: Never  Vaping Use   Vaping Use: Never used  Substance and Sexual Activity   Alcohol use: Yes    Comment: occasional   Drug use: No   Sexual activity: Not on file  Other Topics Concern   Not on file  Social History Narrative   Retired (previous jobs Science writer, private investigation fraud, [employer]   Lived in [place] for a while with husband who is from there   Lived all over [place]. From [place]  1 son lives in [place] now but lived [place] prior with 1 grandson in [place] and 1 grandson in [place]. Wine denies cig/drugs    Likes to exercise    Sexually active with current husband since age 24 y.o    Family origin from [place] Resource Strain: Low Risk    Difficulty of Paying Living Expenses: Not hard at all  Food Insecurity: No Food Insecurity   Worried About Charity fundraiser in the Last Year: Never true   Arboriculturist in the Last Year: Never true  Transportation Needs: No Transportation Needs   Lack of Transportation (Medical): No   Lack of Transportation (Non-Medical): No  Physical Activity: Sufficiently Active   Days of Exercise per Week: 5 days   Minutes of Exercise per Session: 30 min  Stress: No Stress Concern Present   Feeling of Stress : Not at all  Social Connections:  Moderately Isolated   Frequency of Communication with Friends and Family: More than three times a week   Frequency of Social Gatherings with Friends and Family: More than three times a week   Attends Religious Services: Never   Marine scientist or Organizations: No   Attends Archivist Meetings: Never   Marital Status: Married    Tobacco Counseling Counseling given: Not Answered   Clinical Intake:  Pre-visit preparation completed: Yes Activities of Daily Living In your present state of health, do you have any difficulty performing the following activities: 09/10/2021  Hearing? N  Vision? N  Comment Wears glasses  Difficulty concentrating or making decisions? N  Walking or climbing stairs? N  Dressing or bathing? N  Doing errands, shopping? N  Preparing Food and eating ? N  Using the Toilet? N  In the past six months, have you accidently leaked urine? N  Do you have problems with loss of bowel control? N  Managing your Medications? N  Managing your Finances? N  Housekeeping or managing your Housekeeping? N  Some recent data might be hidden    Patient Care Team: McLean-Scocuzza, Nino Glow, MD as PCP - General (Internal Medicine)  Indicate any recent Medical Services you may have received from other than [facility] providers in the past year (date may be approximate).     Assessment:   This is a routine wellness examination for Diana Monroe. Virtual Visit via Telephone Note  I connected with  Diana Monroe on 09/10/21 at 11:15 AM EDT by telephone and verified that I am speaking with the correct person using two identifiers.  Location:  Patient: Home Provider: Office  Persons participating in the virtual visit: patient/Nurse Health Advisor   I discussed the limitations, risks, security and privacy concerns of performing an evaluation and management service by telephone and the availability of in person appointments. The patient expressed understanding and agreed  to proceed.  Interactive audio and video telecommunications were attempted between this nurse and patient, however failed, due to patient having technical difficulties OR patient did not have access to video capability.  We continued and completed visit with audio only.  Some vital signs may be absent or patient reported.   Criselda Peaches, LPN  Hearing/Vision screen Hearing Screening - Comments:: No difficulty hearing Vision Screening - Comments:: Wears glasses  Dietary issues and exercise activities discussed: Current Exercise Habits: Home exercise routine, Type of exercise: treadmill;walking, Time (Minutes): 20, Frequency (Times/Week): 5, Weekly Exercise (Minutes/Week):  100, Intensity: Moderate Regular Diet  Goals Addressed             This Visit's Progress    Follow up with Primary Care Provider       Maintain healthy lifestyle and do some traveling.        Depression Screen PHQ 2/9 Scores 09/10/2021 09/07/2020 09/29/2019 09/07/2019  PHQ - 2 Score 0 0 0 0    Fall Risk Fall Risk  09/10/2021 04/17/2021 10/17/2020 09/07/2020 09/29/2019  Falls in the past year? 0 0 0 0 0  Comment - - - - -  Number falls in past yr: 0 0 0 0 -  Injury with Fall? 0 0 0 - -  Follow up - Falls evaluation completed Falls evaluation completed Falls evaluation completed -    FALL RISK PREVENTION PERTAINING TO THE HOME:  Any stairs in or around the home? Yes  If so, are there any without handrails? Yes . Patient monitors footing. No history of falls.  Home free of loose throw rugs in walkways, pet beds, electrical cords, etc?Yes  Adequate lighting in your home to reduce risk of falls? Yes   ASSISTIVE DEVICES UTILIZED TO PREVENT FALLS:  Life alert? No  Use of a cane, walker or w/c? No  Grab bars in the bathroom? Yes  Shower chair or bench in shower? Yes  Elevated toilet seat or a handicapped toilet? Yes   TIMED UP AND GO:  Was the test performed? No . Audio Visit  Cognitive Function:      6CIT Screen 09/10/2021 09/07/2020 09/07/2019  What Year? 0 points 0 points 0 points  What month? 0 points 0 points 0 points  What time? 0 points 0 points 0 points  Count back from 20 0 points 0 points 0 points  Months in reverse 0 points 0 points 0 points  Repeat phrase 0 points - 0 points  Total Score 0 - 0    Screening Tests Health Maintenance  Topic Date Due   COLONOSCOPY (Pts 45-44yrs Insurance coverage will need to be confirmed)  10/17/2021 (Originally 05/25/1996)   Zoster Vaccines- Shingrix (1 of 2) 12/11/2021 (Originally 05/25/2001)   Pneumonia Vaccine 48+ Years old (1 - PCV) 09/10/2022 (Originally 05/25/2016)   TETANUS/TDAP  09/10/2022 (Originally 05/25/1970)   MAMMOGRAM  10/31/2022   DEXA SCAN  Completed   Hepatitis C Screening  Completed   HPV VACCINES  Aged Out   INFLUENZA VACCINE  Discontinued   COVID-19 Vaccine  Discontinued    Health Maintenance  There are no preventive care reminders to display for this patient. Health Maintenance Vaccines-Declined per patient.  Vision Screening: Recommended annual ophthalmology exams for early detection of glaucoma and other disorders of the eye. Is the patient up to date with their annual eye exam?  Yes  Who is the provider or what is the name of the office in which the patient attends annual eye exams? [facility]  Dental Screening: Recommended annual dental exams for proper oral hygiene  Community Resource Referral / Chronic Care Management: CRR required this visit?  No   CCM required this visit?  No     Plan:     I have personally reviewed and noted the following in the patient's chart:   Medical and social history Use of alcohol, tobacco or illicit drugs  Current medications and supplements including opioid prescriptions. Not currently taking opioids. Functional ability and status Nutritional status Physical activity Advanced directives List of other physicians Hospitalizations,  surgeries, and ER visits in previous 12 months Vitals Screenings to include cognitive, depression, and falls Referrals and appointments  In addition, I have reviewed and discussed with patient certain preventive protocols, quality metrics, and best practice recommendations. A written personalized care plan for preventive services as well as general preventive health recommendations were provided to patient.     Criselda Peaches, LPN   [id number]

## 2021-09-10 NOTE — Patient Instructions (Addendum)
Diana Monroe , Thank you for taking time to come for your Medicare Wellness Visit. I appreciate your ongoing commitment to your health goals. Please review the following plan we discussed and let me know if I can assist you in the future.   These are the goals we discussed:  Goals      Follow up with Primary Care Provider     Maintain healthy lifestyle and do some traveling.         This is a list of the screening recommended for you and due dates:  Health Maintenance  Topic Date Due   Colon Cancer Screening  10/17/2021*   Zoster (Shingles) Vaccine (1 of 2) 12/11/2021*   Pneumonia Vaccine (1 - PCV) 09/10/2022*   Tetanus Vaccine  09/10/2022*   Mammogram  10/31/2022   DEXA scan (bone density measurement)  Completed   Hepatitis C Screening: USPSTF Recommendation to screen - Ages 70-79 yo.  Completed   HPV Vaccine  Aged Out   Flu Shot  Discontinued   COVID-19 Vaccine  Discontinued  *Topic was postponed. The date shown is not the original due date.  Advanced directives: No Patient deferred.  Conditions/risks identified: No  Next appointment: Follow up in one year for your annual wellness visit    Preventive Care 65 Years and Older, Female Preventive care refers to lifestyle choices and visits with your health care provider that can promote health and wellness. What does preventive care include? A yearly physical exam. This is also called an annual well check. Dental exams once or twice a year. Routine eye exams. Ask your health care provider how often you should have your eyes checked. Personal lifestyle choices, including: Daily care of your teeth and gums. Regular physical activity. Eating a healthy diet. Avoiding tobacco and drug use. Limiting alcohol use. Practicing safe sex. Taking low-dose aspirin every day. Taking vitamin and mineral supplements as recommended by your health care provider. What happens during an annual well check? The services and screenings done by  your health care provider during your annual well check will depend on your age, overall health, lifestyle risk factors, and family history of disease. Counseling  Your health care provider may ask you questions about your: Alcohol use. Tobacco use. Drug use. Emotional well-being. Home and relationship well-being. Sexual activity. Eating habits. History of falls. Memory and ability to understand (cognition). Work and work Statistician. Reproductive health. Screening  You may have the following tests or measurements: Height, weight, and BMI. Blood pressure. Lipid and cholesterol levels. These may be checked every 5 years, or more frequently if you are over 20 years old. Skin check. Lung cancer screening. You may have this screening every year starting at age 68 if you have a 30-pack-year history of smoking and currently smoke or have quit within the past 15 years. Fecal occult blood test (FOBT) of the stool. You may have this test every year starting at age 75. Flexible sigmoidoscopy or colonoscopy. You may have a sigmoidoscopy every 5 years or a colonoscopy every 10 years starting at age 31. Hepatitis C blood test. Hepatitis B blood test. Sexually transmitted disease (STD) testing. Diabetes screening. This is done by checking your blood sugar (glucose) after you have not eaten for a while (fasting). You may have this done every 1-3 years. Bone density scan. This is done to screen for osteoporosis. You may have this done starting at age 30. Mammogram. This may be done every 1-2 years. Talk to your health care provider  about how often you should have regular mammograms. Talk with your health care provider about your test results, treatment options, and if necessary, the need for more tests. Vaccines  Your health care provider may recommend certain vaccines, such as: Influenza vaccine. This is recommended every year. Tetanus, diphtheria, and acellular pertussis (Tdap, Td) vaccine. You  may need a Td booster every 10 years. Zoster vaccine. You may need this after age 88. Pneumococcal 13-valent conjugate (PCV13) vaccine. One dose is recommended after age 90. Pneumococcal polysaccharide (PPSV23) vaccine. One dose is recommended after age 43. Talk to your health care provider about which screenings and vaccines you need and how often you need them. This information is not intended to replace advice given to you by your health care provider. Make sure you discuss any questions you have with your health care provider. Document Released: 11/23/2015 Document Revised: 07/16/2016 Document Reviewed: 08/28/2015 Elsevier Interactive Patient Education  2017 [employer] Prevention in the Home Falls can cause injuries. They can happen to people of all ages. There are many things you can do to make your home safe and to help prevent falls. What can I do on the outside of my home? Regularly fix the edges of walkways and driveways and fix any cracks. Remove anything that might make you trip as you walk through a door, such as a raised step or threshold. Trim any bushes or trees on the path to your home. Use bright outdoor lighting. Clear any walking paths of anything that might make someone trip, such as rocks or tools. Regularly check to see if handrails are loose or broken. Make sure that both sides of any steps have handrails. Any raised decks and porches should have guardrails on the edges. Have any leaves, snow, or ice cleared regularly. Use sand or salt on walking paths during winter. Clean up any spills in your garage right away. This includes oil or grease spills. What can I do in the bathroom? Use night lights. Install grab bars by the toilet and in the tub and shower. Do not use towel bars as grab bars. Use non-skid mats or decals in the tub or shower. If you need to sit down in the shower, use a plastic, non-slip stool. Keep the floor dry. Clean up any water that spills  on the floor as soon as it happens. Remove soap buildup in the tub or shower regularly. Attach bath mats securely with double-sided non-slip rug tape. Do not have throw rugs and other things on the floor that can make you trip. What can I do in the bedroom? Use night lights. Make sure that you have a light by your bed that is easy to reach. Do not use any sheets or blankets that are too big for your bed. They should not hang down onto the floor. Have a firm chair that has side arms. You can use this for support while you get dressed. Do not have throw rugs and other things on the floor that can make you trip. What can I do in the kitchen? Clean up any spills right away. Avoid walking on wet floors. Keep items that you use a lot in easy-to-reach places. If you need to reach something above you, use a strong step stool that has a grab bar. Keep electrical cords out of the way. Do not use floor polish or wax that makes floors slippery. If you must use wax, use non-skid floor wax. Do not have throw rugs and  other things on the floor that can make you trip. What can I do with my stairs? Do not leave any items on the stairs. Make sure that there are handrails on both sides of the stairs and use them. Fix handrails that are broken or loose. Make sure that handrails are as long as the stairways. Check any carpeting to make sure that it is firmly attached to the stairs. Fix any carpet that is loose or worn. Avoid having throw rugs at the top or bottom of the stairs. If you do have throw rugs, attach them to the floor with carpet tape. Make sure that you have a light switch at the top of the stairs and the bottom of the stairs. If you do not have them, ask someone to add them for you. What else can I do to help prevent falls? Wear shoes that: Do not have high heels. Have rubber bottoms. Are comfortable and fit you well. Are closed at the toe. Do not wear sandals. If you use a stepladder: Make  sure that it is fully opened. Do not climb a closed stepladder. Make sure that both sides of the stepladder are locked into place. Ask someone to hold it for you, if possible. Clearly mark and make sure that you can see: Any grab bars or handrails. First and last steps. Where the edge of each step is. Use tools that help you move around (mobility aids) if they are needed. These include: Canes. Walkers. Scooters. Crutches. Turn on the lights when you go into a dark area. Replace any light bulbs as soon as they burn out. Set up your furniture so you have a clear path. Avoid moving your furniture around. If any of your floors are uneven, fix them. If there are any pets around you, be aware of where they are. Review your medicines with your doctor. Some medicines can make you feel dizzy. This can increase your chance of falling. Ask your doctor what other things that you can do to help prevent falls. This information is not intended to replace advice given to you by your health care provider. Make sure you discuss any questions you have with your health care provider. Document Released: 08/23/2009 Document Revised: 04/03/2016 Document Reviewed: 12/01/2014 Elsevier Interactive Patient Education  2017 [employer].

## 2021-09-19 DIAGNOSIS — K5900 Constipation, unspecified: Secondary | ICD-10-CM | POA: Diagnosis not present

## 2021-09-19 DIAGNOSIS — R195 Other fecal abnormalities: Secondary | ICD-10-CM | POA: Diagnosis not present

## 2021-09-19 DIAGNOSIS — I10 Essential (primary) hypertension: Secondary | ICD-10-CM | POA: Diagnosis not present

## 2021-10-18 ENCOUNTER — Ambulatory Visit: Payer: Medicare Other | Admitting: Internal Medicine

## 2021-11-05 ENCOUNTER — Other Ambulatory Visit: Payer: Self-pay

## 2021-11-05 ENCOUNTER — Ambulatory Visit
Admission: RE | Admit: 2021-11-05 | Discharge: 2021-11-05 | Disposition: A | Discharge status: Home / Self Care | Payer: Medicare Other | Source: Ambulatory Visit | Attending: Internal Medicine | Admitting: Internal Medicine

## 2021-11-05 DIAGNOSIS — Z1231 Encounter for screening mammogram for malignant neoplasm of breast: Secondary | ICD-10-CM

## 2021-11-05 DIAGNOSIS — M8580 Other specified disorders of bone density and structure, unspecified site: Secondary | ICD-10-CM | POA: Insufficient documentation

## 2021-11-05 DIAGNOSIS — E2839 Other primary ovarian failure: Secondary | ICD-10-CM | POA: Insufficient documentation

## 2021-11-05 DIAGNOSIS — Z780 Asymptomatic menopausal state: Secondary | ICD-10-CM | POA: Diagnosis not present

## 2021-11-05 DIAGNOSIS — M8589 Other specified disorders of bone density and structure, multiple sites: Secondary | ICD-10-CM | POA: Diagnosis not present

## 2021-11-08 ENCOUNTER — Telehealth: Payer: Self-pay | Admitting: *Deleted

## 2021-11-08 NOTE — Telephone Encounter (Signed)
Patient has been given results.  °

## 2021-11-08 NOTE — Telephone Encounter (Signed)
-----   Message from Delorise Jackson, MD sent at 11/05/2021  3:58 PM EST ----- Bone density + thin bones osteopenia consider calcium 600 mg 1-2 x per day with vitamin D3 2000 to 4000 IU daily  Arthritis low back

## 2021-11-08 NOTE — Telephone Encounter (Signed)
Patient called in and stated that she is returning a call back, Please call patient back @ [phone]

## 2021-11-08 NOTE — Telephone Encounter (Signed)
Please advise patien tof below message.

## 2021-11-12 DIAGNOSIS — K640 First degree hemorrhoids: Secondary | ICD-10-CM | POA: Diagnosis not present

## 2021-11-12 DIAGNOSIS — D125 Benign neoplasm of sigmoid colon: Secondary | ICD-10-CM | POA: Diagnosis not present

## 2021-11-12 DIAGNOSIS — K5730 Diverticulosis of large intestine without perforation or abscess without bleeding: Secondary | ICD-10-CM | POA: Diagnosis not present

## 2021-11-12 DIAGNOSIS — R195 Other fecal abnormalities: Secondary | ICD-10-CM | POA: Diagnosis not present

## 2021-11-12 LAB — HM COLONOSCOPY

## 2021-12-06 ENCOUNTER — Encounter: Payer: Self-pay | Admitting: Internal Medicine

## 2021-12-06 ENCOUNTER — Other Ambulatory Visit: Payer: Self-pay

## 2021-12-06 ENCOUNTER — Ambulatory Visit (INDEPENDENT_AMBULATORY_CARE_PROVIDER_SITE_OTHER): Payer: Medicare Other | Admitting: Internal Medicine

## 2021-12-06 VITALS — BP 136/70 | HR 81 | Temp 98.0°F | Ht 64.0 in | Wt 165.6 lb

## 2021-12-06 DIAGNOSIS — R946 Abnormal results of thyroid function studies: Secondary | ICD-10-CM | POA: Diagnosis not present

## 2021-12-06 DIAGNOSIS — E559 Vitamin D deficiency, unspecified: Secondary | ICD-10-CM | POA: Diagnosis not present

## 2021-12-06 DIAGNOSIS — Z1329 Encounter for screening for other suspected endocrine disorder: Secondary | ICD-10-CM | POA: Diagnosis not present

## 2021-12-06 DIAGNOSIS — R7303 Prediabetes: Secondary | ICD-10-CM

## 2021-12-06 DIAGNOSIS — Z1231 Encounter for screening mammogram for malignant neoplasm of breast: Secondary | ICD-10-CM | POA: Diagnosis not present

## 2021-12-06 DIAGNOSIS — I10 Essential (primary) hypertension: Secondary | ICD-10-CM | POA: Diagnosis not present

## 2021-12-06 NOTE — Progress Notes (Signed)
Chief Complaint  Patient presents with   Follow-up   F/u  1. Htn on norvasc 2.5 mg bid    Review of Systems  Constitutional:  Negative for weight loss.  HENT:  Negative for hearing loss.   Eyes:  Negative for blurred vision.  Respiratory:  Negative for shortness of breath.   Cardiovascular:  Negative for chest pain.  Gastrointestinal:  Negative for abdominal pain and blood in stool.  Genitourinary:  Negative for dysuria.  Musculoskeletal:  Negative for falls and joint pain.  Skin:  Negative for rash.  Neurological:  Negative for headaches.  Psychiatric/Behavioral:  Negative for depression.   Past Medical History:  Diagnosis Date   Chicken pox    Glaucoma    left eye [facility] [facility]    Hyperlipidemia    Hypertension    Vitamin D deficiency    Wears glasses    Past Surgical History:  Procedure Laterality Date   CESAREAN SECTION     x 1    Family History  Problem Relation Age of Onset   Stroke Mother    Hypertension Mother    Diabetes Mother    Cancer Father        prostate    Hyperlipidemia Father    Diabetes Father    Hyperlipidemia Other        grandparents    Breast cancer Neg Hx    Social History   Socioeconomic History   Marital status: Married    Spouse name: Not on file   Number of children: Not on file   Years of education: Not on file   Highest education level: Not on file  Occupational History   Occupation: retired  Tobacco Use   Smoking status: Never   Smokeless tobacco: Never  Vaping Use   Vaping Use: Never used  Substance and Sexual Activity   Alcohol use: Yes    Comment: occasional   Drug use: No   Sexual activity: Not on file  Other Topics Concern   Not on file  Social History Narrative   Retired (previous jobs Science writer, private investigation fraud, [employer]   Lived in [place] for a while with husband who is from there   Lived all over [place]. From [place]    1 son lives in [place] now but lived [place] prior with 1 grandson in  [place] and 1 grandson in [place]. Wine denies cig/drugs    Likes to exercise    Sexually active with current husband since age 72 y.o    Family origin from [place] Resource Strain: Low Risk    Difficulty of Paying Living Expenses: Not hard at all  Food Insecurity: No Food Insecurity   Worried About Charity fundraiser in the Last Year: Never true   Arboriculturist in the Last Year: Never true  Transportation Needs: No Transportation Needs   Lack of Transportation (Medical): No   Lack of Transportation (Non-Medical): No  Physical Activity: Sufficiently Active   Days of Exercise per Week: 5 days   Minutes of Exercise per Session: 30 min  Stress: No Stress Concern Present   Feeling of Stress : Not at all  Social Connections: Moderately Isolated   Frequency of Communication with Friends and Family: More than three times a week   Frequency of Social Gatherings with Friends and Family: More than three times a week   Attends Religious  Services: Never   Marine scientist or Organizations: No   Attends Music therapist: Never   Marital Status: Married  Human resources officer Violence: Not At Risk   Fear of Current or Ex-Partner: No   Emotionally Abused: No   Physically Abused: No   Sexually Abused: No   Current Meds  Medication Sig   amLODipine (NORVASC) 2.5 MG tablet Take 1 tablet (2.5 mg total) by mouth in the morning and at bedtime.   Ascorbic Acid (VITAMIN C PO) Take by mouth.   ascorbic acid (VITAMIN C) 500 MG tablet Take 1 tablet by mouth 2 (two) times daily.   calcium-vitamin D (OSCAL WITH D) 500-200 MG-UNIT TABS tablet Take by mouth.   magnesium 30 MG tablet Take 30 mg daily by mouth.   vitamin B-12 (CYANOCOBALAMIN) 100 MCG tablet Take 100 mcg daily by mouth.   zinc gluconate 50 MG tablet    Allergies  Allergen Reactions   Lisinopril     Cough    No results found for this or any previous visit (from the  past 2160 hour(s)). Objective  Body mass index is 28.43 kg/m. Wt Readings from Last 3 Encounters:  12/06/21 165 lb 9.6 oz (75.1 kg)  09/10/21 163 lb (73.9 kg)  04/17/21 163 lb 4.8 oz (74.1 kg)   Temp Readings from Last 3 Encounters:  12/06/21 98 F (36.7 C) (Oral)  04/17/21 98.4 F (36.9 C) (Oral)  10/17/20 98.2 F (36.8 C) (Oral)   BP Readings from Last 3 Encounters:  12/06/21 136/70  04/17/21 136/80  10/17/20 (!) 148/88   Pulse Readings from Last 3 Encounters:  12/06/21 81  04/17/21 71  10/17/20 87    Physical Exam Vitals and nursing note reviewed.  Constitutional:      Appearance: Normal appearance. She is well-developed and well-groomed.  HENT:     Head: Normocephalic and atraumatic.  Eyes:     Conjunctiva/sclera: Conjunctivae normal.     Pupils: Pupils are equal, round, and reactive to light.  Cardiovascular:     Rate and Rhythm: Normal rate and regular rhythm.     Heart sounds: Normal heart sounds. No murmur heard. Pulmonary:     Effort: Pulmonary effort is normal.     Breath sounds: Normal breath sounds.  Abdominal:     General: Abdomen is flat. Bowel sounds are normal.     Tenderness: There is no abdominal tenderness.  Musculoskeletal:        General: No tenderness.  Skin:    General: Skin is warm and dry.  Neurological:     General: No focal deficit present.     Mental Status: She is alert and oriented to person, place, and time. Mental status is at baseline.     Cranial Nerves: Cranial nerves 2-12 are intact.     Gait: Gait is intact.  Psychiatric:        Attention and Perception: Attention and perception normal.        Mood and Affect: Mood and affect normal.        Speech: Speech normal.        Behavior: Behavior normal. Behavior is cooperative.        Thought Content: Thought content normal.        Cognition and Memory: Cognition and memory normal.        Judgment: Judgment normal.    Assessment  Plan  Hypertension, unspecified type -  Plan: Comprehensive metabolic panel, Lipid panel, CBC w/Diff Norvasc  2.5 mg bid   Vitamin D deficiency - Plan: Vitamin D (25 hydroxy)  Prediabetes - Plan: Hemoglobin A1c  Thyroid disorder screen  Abnormal thyroid function test - Plan: TSH   HM Declines vaccines (shingrix, prevnar, flu, covid, Tdap) -Tdap consider rx  04/17/21 declines as of 12/06/21      MMR immune   04/20/18 cologaurd ordered colonoscopy had 09/19/21    mammo UTD neg 11/05/21 ordered 2023    Pap UTD 10/22/17 negative pap neg HPV   dexa 05/05/18 osteopenia rec calcium and vit ordered   D5000 IU D3 qd     Eye MD appt will sch 05/2021 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 Olivia Mackie McLean-Scocuzza-Internal Medicine

## 2021-12-06 NOTE — Patient Instructions (Signed)
Mielle organics rosemary hair growth target   Nioxin scalp therapy    Giovannis tea tree shampoo/conditioner

## 2021-12-17 ENCOUNTER — Encounter: Payer: Self-pay | Admitting: Internal Medicine

## 2022-06-06 ENCOUNTER — Ambulatory Visit: Payer: Medicare Other | Admitting: Internal Medicine

## 2022-07-29 ENCOUNTER — Telehealth: Payer: Self-pay

## 2022-07-29 ENCOUNTER — Ambulatory Visit (INDEPENDENT_AMBULATORY_CARE_PROVIDER_SITE_OTHER): Payer: Medicare Other | Admitting: Internal Medicine

## 2022-07-29 ENCOUNTER — Encounter: Payer: Self-pay | Admitting: Internal Medicine

## 2022-07-29 VITALS — BP 136/68 | HR 67 | Temp 98.4°F | Ht 64.0 in | Wt 165.2 lb

## 2022-07-29 DIAGNOSIS — R7303 Prediabetes: Secondary | ICD-10-CM | POA: Diagnosis not present

## 2022-07-29 DIAGNOSIS — Z1329 Encounter for screening for other suspected endocrine disorder: Secondary | ICD-10-CM

## 2022-07-29 DIAGNOSIS — Z1231 Encounter for screening mammogram for malignant neoplasm of breast: Secondary | ICD-10-CM

## 2022-07-29 DIAGNOSIS — I10 Essential (primary) hypertension: Secondary | ICD-10-CM

## 2022-07-29 DIAGNOSIS — E559 Vitamin D deficiency, unspecified: Secondary | ICD-10-CM

## 2022-07-29 DIAGNOSIS — E785 Hyperlipidemia, unspecified: Secondary | ICD-10-CM | POA: Diagnosis not present

## 2022-07-29 LAB — LIPID PANEL
Cholesterol: 216 mg/dL — ABNORMAL HIGH (ref 0–200)
HDL: 56.6 mg/dL (ref >39.00)
LDL Cholesterol: 138 mg/dL — ABNORMAL HIGH (ref 0–99)
NonHDL: 158.95
Total CHOL/HDL Ratio: 4
Triglycerides: 106 mg/dL (ref 0.0–149.0)
VLDL: 21.2 mg/dL (ref 0.0–40.0)

## 2022-07-29 LAB — COMPREHENSIVE METABOLIC PANEL
ALT: 15 U/L (ref 0–35)
AST: 13 U/L (ref 0–37)
Albumin: 4 g/dL (ref 3.5–5.2)
Alkaline Phosphatase: 73 U/L (ref 39–117)
BUN: 17 mg/dL (ref 6–23)
CO2: 27 mEq/L (ref 19–32)
Calcium: 9.4 mg/dL (ref 8.4–10.5)
Chloride: 103 mEq/L (ref 96–112)
Creatinine, Ser: 0.8 mg/dL (ref 0.40–1.20)
GFR: 74.26 mL/min (ref >60.00)
Glucose, Bld: 95 mg/dL (ref 70–99)
Potassium: 4.1 mEq/L (ref 3.5–5.1)
Sodium: 138 mEq/L (ref 135–145)
Total Bilirubin: 0.6 mg/dL (ref 0.2–1.2)
Total Protein: 7 g/dL (ref 6.0–8.3)

## 2022-07-29 LAB — CBC WITH DIFFERENTIAL/PLATELET
Basophils Absolute: 0.1 10*3/uL (ref 0.0–0.1)
Basophils Relative: 1.4 % (ref 0.0–3.0)
Eosinophils Absolute: 0.1 10*3/uL (ref 0.0–0.7)
Eosinophils Relative: 1.9 % (ref 0.0–5.0)
HCT: 42.1 % (ref 36.0–46.0)
Hemoglobin: 14 g/dL (ref 12.0–15.0)
Lymphocytes Relative: 26.5 % (ref 12.0–46.0)
Lymphs Abs: 1.6 10*3/uL (ref 0.7–4.0)
MCHC: 33.3 g/dL (ref 30.0–36.0)
MCV: 86.1 fl (ref 78.0–100.0)
Monocytes Absolute: 0.5 10*3/uL (ref 0.1–1.0)
Monocytes Relative: 7.7 % (ref 3.0–12.0)
Neutro Abs: 3.7 10*3/uL (ref 1.4–7.7)
Neutrophils Relative %: 62.5 % (ref 43.0–77.0)
Platelets: 271 10*3/uL (ref 150.0–400.0)
RBC: 4.88 Mil/uL (ref 3.87–5.11)
RDW: 13.4 % (ref 11.5–15.5)
WBC: 6 10*3/uL (ref 4.0–10.5)

## 2022-07-29 LAB — HEMOGLOBIN A1C: Hgb A1c MFr Bld: 6 % (ref 4.6–6.5)

## 2022-07-29 LAB — TSH: TSH: 3.7 u[IU]/mL (ref 0.35–5.50)

## 2022-07-29 LAB — VITAMIN D 25 HYDROXY (VIT D DEFICIENCY, FRACTURES): VITD: 44.19 ng/mL (ref 30.00–100.00)

## 2022-07-29 MED ORDER — AMLODIPINE BESYLATE 2.5 MG PO TABS: 2.5000 mg | ORAL_TABLET | Freq: Two times a day (BID) | ORAL | 3 refills | Status: DC

## 2022-07-29 NOTE — Telephone Encounter (Signed)
Patient states at check-out that she would like for [place] to please mail her a copy of her results.

## 2022-07-29 NOTE — Progress Notes (Signed)
Chief Complaint  Patient presents with   Follow-up    6 month f/u   F/u  1.htn controlled for age on norvasc 2.5 mg bid  2. Declines vaccines doing well    Review of Systems  Constitutional:  Negative for weight loss.  HENT:  Negative for hearing loss.   Eyes:  Negative for blurred vision.  Respiratory:  Negative for shortness of breath.   Cardiovascular:  Negative for chest pain.  Gastrointestinal:  Negative for abdominal pain and blood in stool.  Genitourinary:  Negative for dysuria.  Musculoskeletal:  Negative for falls and joint pain.  Skin:  Negative for rash.  Neurological:  Negative for headaches.  Psychiatric/Behavioral:  Negative for depression.    Past Medical History:  Diagnosis Date   Chicken pox    Glaucoma    left eye [facility] [facility]    Hyperlipidemia    Hypertension    Vitamin D deficiency    Wears glasses    Past Surgical History:  Procedure Laterality Date   CESAREAN SECTION     x 1    Family History  Problem Relation Age of Onset   Stroke Mother    Hypertension Mother    Diabetes Mother    Cancer Father        prostate    Hyperlipidemia Father    Diabetes Father    Hyperlipidemia Other        grandparents    Breast cancer Neg Hx    Social History   Socioeconomic History   Marital status: Married    Spouse name: Not on file   Number of children: Not on file   Years of education: Not on file   Highest education level: Not on file  Occupational History   Occupation: retired  Tobacco Use   Smoking status: Never   Smokeless tobacco: Never  Vaping Use   Vaping Use: Never used  Substance and Sexual Activity   Alcohol use: Yes    Comment: occasional   Drug use: No   Sexual activity: Not on file  Other Topics Concern   Not on file  Social History Narrative   Retired (previous jobs Science writer, private investigation fraud, [employer]   Lived in [place] for a while with husband who is from there   Lived all over [place]. From [place]    1 son lives in [place] now but lived [place] prior with 1 grandson in [place] and 1 grandson in [place]. Wine denies cig/drugs    Likes to exercise    Sexually active with current husband since age 31 y.o    Family origin from [place] Strain: Low Risk  (09/10/2021)   Overall Financial Resource Strain (CARDIA)    Difficulty of Paying Living Expenses: Not hard at all  Food Insecurity: No Food Insecurity (09/10/2021)   Hunger Vital Sign    Worried About Running Out of Food in the Last Year: Never true    Ran Out of Food in the Last Year: Never true  Transportation Needs: No Transportation Needs (09/10/2021)   PRAPARE - Hydrologist (Medical): No    Lack of Transportation (Non-Medical): No  Physical Activity: Sufficiently Active (09/10/2021)   Exercise Vital Sign    Days of Exercise per Week: 5 days    Minutes of Exercise per Session: 30 min  Stress: No Stress Concern Present (09/10/2021)  [employer] - Occupational Stress Questionnaire    Feeling of Stress : Not at all  Social Connections: Moderately Isolated (09/10/2021)   Social Connection and Isolation Panel [NHANES]    Frequency of Communication with Friends and Family: More than three times a week    Frequency of Social Gatherings with Friends and Family: More than three times a week    Attends Religious Services: Never    Marine scientist or Organizations: No    Attends Archivist Meetings: Never    Marital Status: Married  Human resources officer Violence: Not At Risk (09/10/2021)   Humiliation, Afraid, Rape, and Kick questionnaire    Fear of Current or Ex-Partner: No    Emotionally Abused: No    Physically Abused: No    Sexually Abused: No   Current Meds  Medication Sig   Ascorbic Acid (VITAMIN C PO) Take by mouth.   ascorbic acid (VITAMIN C) 500 MG tablet Take 1 tablet by mouth 2 (two) times  daily.   calcium-vitamin D (OSCAL WITH D) 500-200 MG-UNIT TABS tablet Take by mouth.   magnesium 30 MG tablet Take 30 mg daily by mouth.   vitamin B-12 (CYANOCOBALAMIN) 100 MCG tablet Take 100 mcg daily by mouth.   zinc gluconate 50 MG tablet    [DISCONTINUED] amLODipine (NORVASC) 2.5 MG tablet Take 1 tablet (2.5 mg total) by mouth in the morning and at bedtime.   Allergies  Allergen Reactions   Lisinopril     Cough    No results found for this or any previous visit (from the past 2160 hour(s)). Objective  Body mass index is 28.36 kg/m. Wt Readings from Last 3 Encounters:  07/29/22 165 lb 3.2 oz (74.9 kg)  12/06/21 165 lb 9.6 oz (75.1 kg)  09/10/21 163 lb (73.9 kg)   Temp Readings from Last 3 Encounters:  07/29/22 98.4 F (36.9 C) (Oral)  12/06/21 98 F (36.7 C) (Oral)  04/17/21 98.4 F (36.9 C) (Oral)   BP Readings from Last 3 Encounters:  07/29/22 136/68  12/06/21 136/70  04/17/21 136/80   Pulse Readings from Last 3 Encounters:  07/29/22 67  12/06/21 81  04/17/21 71    Physical Exam Vitals and nursing note reviewed.  Constitutional:      Appearance: Normal appearance. She is well-developed and well-groomed.  HENT:     Head: Normocephalic and atraumatic.  Eyes:     Conjunctiva/sclera: Conjunctivae normal.     Pupils: Pupils are equal, round, and reactive to light.  Cardiovascular:     Rate and Rhythm: Normal rate and regular rhythm.     Heart sounds: Normal heart sounds. No murmur heard. Pulmonary:     Effort: Pulmonary effort is normal.     Breath sounds: Normal breath sounds.  Abdominal:     General: Abdomen is flat. Bowel sounds are normal.     Tenderness: There is no abdominal tenderness.  Musculoskeletal:        General: No tenderness.  Skin:    General: Skin is warm and dry.  Neurological:     General: No focal deficit present.     Mental Status: She is alert and oriented to person, place, and time. Mental status is at baseline.     Cranial  Nerves: Cranial nerves 2-12 are intact.     Motor: Motor function is intact.     Coordination: Coordination is intact.     Gait: Gait is intact.  Psychiatric:  Attention and Perception: Attention and perception normal.        Mood and Affect: Mood and affect normal.        Speech: Speech normal.        Behavior: Behavior normal. Behavior is cooperative.        Thought Content: Thought content normal.        Cognition and Memory: Cognition and memory normal.        Judgment: Judgment normal.     Assessment  Plan  Primary hypertension - Plan: Comprehensive metabolic panel, Lipid panel, CBC with Differential/Platelet Norvasc 2.5 mg bid    Hyperlipidemia, unspecified hyperlipidemia type - Plan: Lipid panel   HM Declines vaccines (shingrix, prevnar, flu, covid, Tdap) -Tdap consider rx  04/17/21 declines as of 12/06/21      MMR immune   04/20/18 cologaurd ordered colonoscopy 11/12/21 f/u in 7 years    mammo UTD neg 11/05/21 ordered 2023 + 2024   Pap UTD 10/22/17 negative pap neg HPV   dexa 05/05/18 osteopenia rec calcium and vit ordered   D5000 IU D3 qd      Eye MD appt will sch 05/2021 dry 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 Olivia Mackie McLean-Scocuzza-Internal Medicine

## 2022-07-29 NOTE — Patient Instructions (Addendum)
Mielle organics rosemary scalp hair oil Target  Nutrafol scalp stimulator, collagen, supplements for hair loss   Otc nasal saline +/- Flonase or nasacort  With  Allergy pill at night xyzal, allergra, claritin, zyrtec at night  [employer]    Allergic Rhinitis, Adult  Allergic rhinitis is an allergic reaction that affects the mucous membrane inside the nose. The mucous membrane is the tissue that produces mucus. There are two types of allergic rhinitis: Seasonal. This type is also called hay fever and happens only during certain seasons. Perennial. This type can happen at any time of the year. Allergic rhinitis cannot be spread from person to person. This condition can be mild, moderate, or severe. It can develop at any age and may be outgrown. What are the causes? This condition is caused by allergens. These are things that can cause an allergic reaction. Allergens may differ for seasonal allergic rhinitis and perennial allergic rhinitis. Seasonal allergic rhinitis is triggered by pollen. Pollen can come from grasses, trees, and weeds. Perennial allergic rhinitis may be triggered by: Dust mites. Proteins in a pet's urine, saliva, or dander. Dander is dead skin cells from a pet. Smoke, mold, or car fumes. What increases the risk? You are more likely to develop this condition if you have a family history of allergies or other conditions related to allergies, including: Allergic conjunctivitis. This is inflammation of parts of the eyes and eyelids. Asthma. This condition affects the lungs and makes it hard to breathe. Atopic dermatitis or eczema. This is long term (chronic) inflammation of the skin. Food allergies. What are the signs or symptoms? Symptoms of this condition include: Sneezing or coughing. A stuffy nose (nasal congestion), itchy nose, or nasal discharge. Itchy eyes and tearing of the eyes. A feeling of mucus dripping down the back of your throat (postnasal  drip). Trouble sleeping. Tiredness or fatigue. Headache. Sore throat. How is this diagnosed? This condition may be diagnosed with your symptoms, medical history, and physical exam. Your health care provider may check for related conditions, such as: Asthma. Pink eye. This is eye inflammation caused by infection (conjunctivitis). Ear infection. Upper respiratory infection. This is an infection in the nose, throat, or upper airways. You may also have tests to find out which allergens trigger your symptoms. These may include skin tests or blood tests. How is this treated? There is no cure for this condition, but treatment can help control symptoms. Treatment may include: Taking medicines that block allergy symptoms, such as corticosteroids and antihistamines. Medicine may be given as a shot, nasal spray, or pill. Avoiding any allergens. Being exposed again and again to tiny amounts of allergens to help you build a defense against allergens (immunotherapy). This is done if other treatments have not helped. It may include: Allergy shots. These are injected medicines that have small amounts of allergen in them. Sublingual immunotherapy. This involves taking small doses of a medicine with allergen in it under your tongue. If these treatments do not work, your health care provider may prescribe newer, stronger medicines. Follow these instructions at home: Avoiding allergens Find out what you are allergic to and avoid those allergens. These are some things you can do to help avoid allergens: If you have perennial allergies: Replace carpet with wood, tile, or vinyl flooring. Carpet can trap dander and dust. Do not smoke. Do not allow smoking in your home. Change your heating and air conditioning filters at least once a month. If you have seasonal allergies, take these  steps during allergy season: Keep windows closed as much as possible. Plan outdoor activities when pollen counts are lowest. Check  pollen counts before you plan outdoor activities. When coming indoors, change clothing and shower before sitting on furniture or bedding. If you have a pet in the house that produces allergens: Keep the pet out of the bedroom. Vacuum, sweep, and dust regularly. General instructions Take over-the-counter and prescription medicines only as told by your health care provider. Drink enough fluid to keep your urine pale yellow. Keep all follow-up visits as told by your health care provider. This is important. Where to find more information American Academy of Allergy, Asthma & Immunology: www.aaaai.org Contact a health care provider if: You have a fever. You develop a cough that does not go away. You make whistling sounds when you breathe (wheeze). Your symptoms slow you down or stop you from doing your normal activities each day. Get help right away if: You have shortness of breath. This symptom may represent a serious problem that is an emergency. Do not wait to see if the symptom will go away. Get medical help right away. Call your local emergency services (911 in the [place] Do not drive yourself to the hospital. Summary Allergic rhinitis may be managed by taking medicines as directed and avoiding allergens. If you have seasonal allergies, keep windows closed as much as possible during allergy season. Contact your health care provider if you develop a fever or a cough that does not go away. This information is not intended to replace advice given to you by your health care provider. Make sure you discuss any questions you have with your health care provider. Document Revised: 12/16/2019 Document Reviewed: 10/25/2019 Elsevier Patient Education  [employer].

## 2022-10-09 ENCOUNTER — Other Ambulatory Visit: Payer: Self-pay

## 2022-10-09 ENCOUNTER — Telehealth: Payer: Self-pay | Admitting: Internal Medicine

## 2022-10-09 DIAGNOSIS — Z1239 Encounter for other screening for malignant neoplasm of breast: Secondary | ICD-10-CM

## 2022-10-09 DIAGNOSIS — Z1231 Encounter for screening mammogram for malignant neoplasm of breast: Secondary | ICD-10-CM

## 2022-10-09 NOTE — Telephone Encounter (Signed)
Mammo referral has been placed and pt has been informed.

## 2022-10-09 NOTE — Telephone Encounter (Signed)
Patient called and is requesting a referral for her mammogram, she goes to [facility].  Please call patient when referral is placed.

## 2022-11-06 ENCOUNTER — Ambulatory Visit
Admission: RE | Admit: 2022-11-06 | Discharge: 2022-11-06 | Disposition: A | Discharge status: Home / Self Care | Payer: Medicare Other | Source: Ambulatory Visit | Attending: Family | Admitting: Family

## 2022-11-06 DIAGNOSIS — Z1231 Encounter for screening mammogram for malignant neoplasm of breast: Secondary | ICD-10-CM | POA: Insufficient documentation

## 2023-01-28 NOTE — Progress Notes (Deleted)
   SUBJECTIVE:  No chief complaint on file.  HPI ***  PERTINENT PMH / PSH: ***  OBJECTIVE:  There were no vitals taken for this visit.   Physical Exam  ASSESSMENT/PLAN:  There are no diagnoses linked to this encounter. PDMP reviewed***  No follow-ups on file.  Arra Connaughton, MD 

## 2023-01-29 ENCOUNTER — Encounter: Payer: Medicare Other | Admitting: Family Medicine

## 2023-10-01 ENCOUNTER — Telehealth: Payer: Self-pay

## 2023-10-01 ENCOUNTER — Ambulatory Visit (INDEPENDENT_AMBULATORY_CARE_PROVIDER_SITE_OTHER): Payer: Medicare Other | Admitting: Family

## 2023-10-01 ENCOUNTER — Encounter: Payer: Self-pay | Admitting: Family

## 2023-10-01 VITALS — BP 150/80 | HR 85 | Temp 98.1°F | Ht 64.0 in | Wt 163.8 lb

## 2023-10-01 DIAGNOSIS — I10 Essential (primary) hypertension: Secondary | ICD-10-CM

## 2023-10-01 DIAGNOSIS — M8580 Other specified disorders of bone density and structure, unspecified site: Secondary | ICD-10-CM | POA: Diagnosis not present

## 2023-10-01 DIAGNOSIS — E559 Vitamin D deficiency, unspecified: Secondary | ICD-10-CM

## 2023-10-01 DIAGNOSIS — E785 Hyperlipidemia, unspecified: Secondary | ICD-10-CM | POA: Diagnosis not present

## 2023-10-01 DIAGNOSIS — Z780 Asymptomatic menopausal state: Secondary | ICD-10-CM | POA: Diagnosis not present

## 2023-10-01 DIAGNOSIS — R7309 Other abnormal glucose: Secondary | ICD-10-CM | POA: Diagnosis not present

## 2023-10-01 LAB — CBC WITH DIFFERENTIAL/PLATELET
Basophils Absolute: 0.1 10*3/uL (ref 0.0–0.1)
Basophils Relative: 0.9 % (ref 0.0–3.0)
Eosinophils Absolute: 0.1 10*3/uL (ref 0.0–0.7)
Eosinophils Relative: 0.9 % (ref 0.0–5.0)
HCT: 43.7 % (ref 36.0–46.0)
Hemoglobin: 14.3 g/dL (ref 12.0–15.0)
Lymphocytes Relative: 24.7 % (ref 12.0–46.0)
Lymphs Abs: 1.5 10*3/uL (ref 0.7–4.0)
MCHC: 32.7 g/dL (ref 30.0–36.0)
MCV: 86.7 fL (ref 78.0–100.0)
Monocytes Absolute: 0.4 10*3/uL (ref 0.1–1.0)
Monocytes Relative: 7.4 % (ref 3.0–12.0)
Neutro Abs: 3.9 10*3/uL (ref 1.4–7.7)
Neutrophils Relative %: 66.1 % (ref 43.0–77.0)
Platelets: 303 10*3/uL (ref 150.0–400.0)
RBC: 5.04 Mil/uL (ref 3.87–5.11)
RDW: 13.4 % (ref 11.5–15.5)
WBC: 5.9 10*3/uL (ref 4.0–10.5)

## 2023-10-01 LAB — COMPREHENSIVE METABOLIC PANEL
ALT: 21 U/L (ref 0–35)
AST: 18 U/L (ref 0–37)
Albumin: 4.5 g/dL (ref 3.5–5.2)
Alkaline Phosphatase: 80 U/L (ref 39–117)
BUN: 16 mg/dL (ref 6–23)
CO2: 30 meq/L (ref 19–32)
Calcium: 9.9 mg/dL (ref 8.4–10.5)
Chloride: 101 meq/L (ref 96–112)
Creatinine, Ser: 0.82 mg/dL (ref 0.40–1.20)
GFR: 71.5 mL/min (ref >60.00)
Glucose, Bld: 102 mg/dL — ABNORMAL HIGH (ref 70–99)
Potassium: 4.1 meq/L (ref 3.5–5.1)
Sodium: 139 meq/L (ref 135–145)
Total Bilirubin: 0.6 mg/dL (ref 0.2–1.2)
Total Protein: 7.1 g/dL (ref 6.0–8.3)

## 2023-10-01 LAB — LIPID PANEL
Cholesterol: 248 mg/dL — ABNORMAL HIGH (ref 0–200)
HDL: 57.2 mg/dL (ref >39.00)
LDL Cholesterol: 166 mg/dL — ABNORMAL HIGH (ref 0–99)
NonHDL: 191.08
Total CHOL/HDL Ratio: 4
Triglycerides: 123 mg/dL (ref 0.0–149.0)
VLDL: 24.6 mg/dL (ref 0.0–40.0)

## 2023-10-01 LAB — VITAMIN D 25 HYDROXY (VIT D DEFICIENCY, FRACTURES): VITD: 41.26 ng/mL (ref 30.00–100.00)

## 2023-10-01 LAB — TSH: TSH: 4.31 u[IU]/mL (ref 0.35–5.50)

## 2023-10-01 LAB — HEMOGLOBIN A1C: Hgb A1c MFr Bld: 6.1 % (ref 4.6–6.5)

## 2023-10-01 MED ORDER — AMLODIPINE BESYLATE 5 MG PO TABS
5.0000 mg | ORAL_TABLET | Freq: Every day | ORAL | 3 refills | Status: DC
Start: 2023-10-01 — End: 2023-10-01

## 2023-10-01 MED ORDER — AMLODIPINE BESYLATE 5 MG PO TABS: 5.0000 mg | ORAL_TABLET | Freq: Every day | ORAL | 3 refills | Status: DC

## 2023-10-01 NOTE — Progress Notes (Signed)
Assessment & Plan:  Primary hypertension Assessment & Plan: The 10-year ASCVD risk score (Monque Haggar DK, et al., 2019) is: 21% Slightly elevated today.  Discussed 2017 ACC guidelines and advised blood pressure < 130/80 with ASCVD > 10.%.  Increase amlodipine to 5 mg daily  Orders: -     CBC with Differential/Platelet -     Comprehensive metabolic panel -     TSH -     amLODIPine Besylate; Take 1 tablet (5 mg total) by mouth daily.  Dispense: 90 tablet; Refill: 3  Hyperlipidemia, unspecified hyperlipidemia type -     Lipid panel  Vitamin D deficiency -     VITAMIN D 25 Hydroxy (Vit-D Deficiency, Fractures)  Elevated glucose -     Hemoglobin A1c  Osteopenia, unspecified location Assessment & Plan: She will schedule bone density  Orders: -     DG Bone Density; Future  Asymptomatic postmenopausal state -     DG Bone Density; Future     Return precautions given.   Risks, benefits, and alternatives of the medications and treatment plan prescribed today were discussed, and patient expressed understanding.   Education regarding symptom management and diagnosis given to patient on AVS either electronically or printed.  No follow-ups on file.  Rennie Plowman, FNP  Subjective:    Patient ID: Diana Monroe, female    DOB: 10-May-1951, 72 y.o.   MRN: [id number]  CC: Diana Monroe is a 72 y.o. female who presents today for medication follow up and physical exam.    HPI: She is compliant amlodipine 2.5 mg every day.  At home she has been concerned due to increase blood pressure   BP at home 150/76.   Denies CP, sob, leg swelling   Colorectal Cancer Screening: UTD ,tubular 11/12/21 f/u in 7 years Dr Norma Fredrickson   Breast Cancer Screening: Mammogram due; she declines  Cervical Cancer Screening: Last Pap smear obtained 5 years ago 10/22/2018, negative HPV negative malignancy.  She is no longer screening for cervical cancer  Bone Health screening/DEXA for 65+:due; she  declines  Lung Cancer Screening: Doesn't have 20 year pack year history and age > 62 years yo 89 years        Tetanus - due; declines        Pneumococcal - declines  Exercise: Gets regular exercise walking, cleaning. Alcohol use:  occassional Smoking/tobacco use: Nonsmoker.    Health Maintenance  Topic Date Due   DTaP/Tdap/Td vaccine (1 - Tdap) Never done   Medicare Annual Wellness Visit  09/10/2022   Flu Shot  02/08/2024*   Pneumonia Vaccine (1 of 1 - PCV) 09/30/2024*   Mammogram  11/07/2023   Colon Cancer Screening  11/12/2028   DEXA scan (bone density measurement)  Completed   Hepatitis C Screening  Completed   HPV Vaccine  Aged Out   COVID-19 Vaccine  Discontinued   Zoster (Shingles) Vaccine  Discontinued  *Topic was postponed. The date shown is not the original due date.    ALLERGIES: Lisinopril  Current Outpatient Medications on File Prior to Visit  Medication Sig Dispense Refill   Ascorbic Acid (VITAMIN C PO) Take by mouth.     ascorbic acid (VITAMIN C) 500 MG tablet Take 1 tablet by mouth 2 (two) times daily.     calcium-vitamin D (OSCAL WITH D) 500-200 MG-UNIT TABS tablet Take by mouth.     dorzolamide-timolol (COSOPT) 22.3-6.8 MG/ML ophthalmic solution Place 1 drop into the left eye 2 (two) times daily. (Patient not taking:  Reported on 12/06/2021)     ipratropium (ATROVENT) 0.06 % nasal spray Place 2 sprays into both nostrils 3 (three) times daily as needed for rhinitis. (Patient not taking: Reported on 12/06/2021) 15 mL 12   LATANOPROST OP Apply 1 drop to eye 1 day or 1 dose. B/l eyes 1x (Patient not taking: Reported on 12/06/2021)     magnesium 30 MG tablet Take 30 mg daily by mouth.     vitamin B-12 (CYANOCOBALAMIN) 100 MCG tablet Take 100 mcg daily by mouth.     zinc gluconate 50 MG tablet      No current facility-administered medications on file prior to visit.    Review of Systems  Constitutional:  Negative for chills and fever.  Respiratory:  Negative for  cough.   Cardiovascular:  Negative for chest pain and palpitations.  Gastrointestinal:  Negative for nausea and vomiting.      Objective:    BP (!) 150/80   Pulse 85   Temp 98.1 F (36.7 C) (Oral)   Ht 5\' 4"  (1.626 m)   Wt 163 lb 12.8 oz (74.3 kg)   SpO2 96%   BMI 28.12 kg/m   BP Readings from Last 3 Encounters:  10/01/23 (!) 150/80  07/29/22 136/68  12/06/21 136/70   Wt Readings from Last 3 Encounters:  10/01/23 163 lb 12.8 oz (74.3 kg)  07/29/22 165 lb 3.2 oz (74.9 kg)  12/06/21 165 lb 9.6 oz (75.1 kg)    Physical Exam Vitals reviewed.  Constitutional:      Appearance: She is well-developed.  Eyes:     Conjunctiva/sclera: Conjunctivae normal.  Cardiovascular:     Rate and Rhythm: Normal rate and regular rhythm.     Pulses: Normal pulses.     Heart sounds: Normal heart sounds.  Pulmonary:     Effort: Pulmonary effort is normal.     Breath sounds: Normal breath sounds. No wheezing, rhonchi or rales.  Skin:    General: Skin is warm and dry.  Neurological:     Mental Status: She is alert.  Psychiatric:        Speech: Speech normal.        Behavior: Behavior normal.        Thought Content: Thought content normal.

## 2023-10-01 NOTE — Assessment & Plan Note (Signed)
She will schedule bone density

## 2023-10-01 NOTE — Telephone Encounter (Signed)
Spoke to pt and informed her that rx was resent to [facility]

## 2023-10-01 NOTE — Assessment & Plan Note (Addendum)
The 10-year ASCVD risk score (Diana Monroe, et al., 2019) is: 21% Slightly elevated today.  Discussed 2017 ACC guidelines and advised blood pressure < 130/80 with ASCVD > 10.%.  Increase amlodipine to 5 mg daily

## 2023-10-01 NOTE — Patient Instructions (Addendum)
Please call  and schedule your  bone density scan as we discussed.   [facility]  ( new location in 2023)  [address], [place], [place] [address]  [place], [place]  [phone]   Increase amlodipine from 2.5 mg daily to 5 mg daily.  Goal of blood pressure is closer to 130/80 or less.  If persistently higher, please make sooner follow up appointment so we can recheck you blood pressure and manage/ adjust medications.  Managing Your Hypertension Hypertension, also called high blood pressure, is when the force of the blood pressing against the walls of the arteries is too strong. Arteries are blood vessels that carry blood from your heart throughout your body. Hypertension forces the heart to work harder to pump blood and may cause the arteries to become narrow or stiff. Understanding blood pressure readings A blood pressure reading includes a higher number over a lower number: The first, or top, number is called the systolic pressure. It is a measure of the pressure in your arteries as your heart beats. The second, or bottom number, is called the diastolic pressure. It is a measure of the pressure in your arteries as the heart relaxes. For most people, a normal blood pressure is below 120/80. Your personal target blood pressure may vary depending on your medical conditions, your age, and other factors. Blood pressure is classified into four stages. Based on your blood pressure reading, your health care provider may use the following stages to determine what type of treatment you need, if any. Systolic pressure and diastolic pressure are measured in a unit called millimeters of mercury (mmHg). Normal Systolic pressure: below 120. Diastolic pressure: below 80. Elevated Systolic pressure: 120-129. Diastolic pressure: below 80. Hypertension stage 1 Systolic pressure: 130-139. Diastolic pressure: 80-89. Hypertension stage 2 Systolic pressure: 140 or  above. Diastolic pressure: 90 or above. How can this condition affect me? Managing your hypertension is very important. Over time, hypertension can damage the arteries and decrease blood flow to parts of the body, including the brain, heart, and kidneys. Having untreated or uncontrolled hypertension can lead to: A heart attack. A stroke. A weakened blood vessel (aneurysm). Heart failure. Kidney damage. Eye damage. Memory and concentration problems. Vascular dementia. What actions can I take to manage this condition? Hypertension can be managed by making lifestyle changes and possibly by taking medicines. Your health care provider will help you make a plan to bring your blood pressure within a normal range. You may be referred for counseling on a healthy diet and physical activity. Nutrition  Eat a diet that is high in fiber and potassium, and low in salt (sodium), added sugar, and fat. An example eating plan is called the DASH diet. DASH stands for Dietary Approaches to Stop Hypertension. To eat this way: Eat plenty of fresh fruits and vegetables. Try to fill one-half of your plate at each meal with fruits and vegetables. Eat whole grains, such as whole-wheat pasta, brown rice, or whole-grain bread. Fill about one-fourth of your plate with whole grains. Eat low-fat dairy products. Avoid fatty cuts of meat, processed or cured meats, and poultry with skin. Fill about one-fourth of your plate with lean proteins such as fish, chicken without skin, beans, eggs, and tofu. Avoid pre-made and processed foods. These tend to be higher in sodium, added sugar, and fat. Reduce your daily sodium intake. Many people with hypertension should eat less than 1,500 mg of sodium a day. Lifestyle  Work with your health  care provider to maintain a healthy body weight or to lose weight. Ask what an ideal weight is for you. Get at least 30 minutes of exercise that causes your heart to beat faster (aerobic exercise)  most days of the week. Activities may include walking, swimming, or biking. Include exercise to strengthen your muscles (resistance exercise), such as weight lifting, as part of your weekly exercise routine. Try to do these types of exercises for 30 minutes at least 3 days a week. Do not use any products that contain nicotine or tobacco. These products include cigarettes, chewing tobacco, and vaping devices, such as e-cigarettes. If you need help quitting, ask your health care provider. Control any long-term (chronic) conditions you have, such as high cholesterol or diabetes. Identify your sources of stress and find ways to manage stress. This may include meditation, deep breathing, or making time for fun activities. Alcohol use Do not drink alcohol if: Your health care provider tells you not to drink. You are pregnant, may be pregnant, or are planning to become pregnant. If you drink alcohol: Limit how much you have to: 0-1 drink a day for women. 0-2 drinks a day for men. Know how much alcohol is in your drink. In the [place] one drink equals one 12 oz bottle of beer (355 mL), one 5 oz glass of wine (148 mL), or one 1 oz glass of hard liquor (44 mL). Medicines Your health care provider may prescribe medicine if lifestyle changes are not enough to get your blood pressure under control and if: Your systolic blood pressure is 130 or higher. Your diastolic blood pressure is 80 or higher. Take medicines only as told by your health care provider. Follow the directions carefully. Blood pressure medicines must be taken as told by your health care provider. The medicine does not work as well when you skip doses. Skipping doses also puts you at risk for problems. Monitoring Before you monitor your blood pressure: Do not smoke, drink caffeinated beverages, or exercise within 30 minutes before taking a measurement. Use the bathroom and empty your bladder (urinate). Sit quietly for at least 5 minutes  before taking measurements. Monitor your blood pressure at home as told by your health care provider. To do this: Sit with your back straight and supported. Place your feet flat on the floor. Do not cross your legs. Support your arm on a flat surface, such as a table. Make sure your upper arm is at heart level. Each time you measure, take two or three readings one minute apart and record the results. You may also need to have your blood pressure checked regularly by your health care provider. General information Talk with your health care provider about your diet, exercise habits, and other lifestyle factors that may be contributing to hypertension. Review all the medicines you take with your health care provider because there may be side effects or interactions. Keep all follow-up visits. Your health care provider can help you create and adjust your plan for managing your high blood pressure. Where to find more information National Heart, Lung, and Blood Institute: PopSteam.is American Heart Association: www.heart.org Contact a health care provider if: You think you are having a reaction to medicines you have taken. You have repeated (recurrent) headaches. You feel dizzy. You have swelling in your ankles. You have trouble with your vision. Get help right away if: You develop a severe headache or confusion. You have unusual weakness or numbness, or you feel faint. You have severe pain  in your chest or abdomen. You vomit repeatedly. You have trouble breathing. These symptoms may be an emergency. Get help right away. Call 911. Do not wait to see if the symptoms will go away. Do not drive yourself to the hospital. Summary Hypertension is when the force of blood pumping through your arteries is too strong. If this condition is not controlled, it may put you at risk for serious complications. Your personal target blood pressure may vary depending on your medical conditions, your age,  and other factors. For most people, a normal blood pressure is less than 120/80. Hypertension is managed by lifestyle changes, medicines, or both. Lifestyle changes to help manage hypertension include losing weight, eating a healthy, low-sodium diet, exercising more, stopping smoking, and limiting alcohol. This information is not intended to replace advice given to you by your health care provider. Make sure you discuss any questions you have with your health care provider. Document Revised: 07/11/2021 Document Reviewed: 07/11/2021 Elsevier Patient Education  2024 [employer].

## 2023-10-01 NOTE — Telephone Encounter (Signed)
Patient states she just saw Rennie Plowman, FNP, today and she sent her prescription for amLODipine (NORVASC) 5 MG tablet to [facility] on [address], but patient states she would like for it to go to [facility] in [place].

## 2023-10-06 ENCOUNTER — Telehealth: Payer: Self-pay

## 2023-10-06 NOTE — Telephone Encounter (Signed)
LVM to call back to go over lab results below

## 2023-10-06 NOTE — Telephone Encounter (Signed)
-----   Message from Rennie Plowman sent at 10/01/2023  9:39 PM EST ----- Call pt LDL cholesterol is quite elevated, increased from 1 38-1 66.  Please confirm that she was fasting for this lab draw. She has an elevated cardiovascular risk score, 10-year ASCVD risk score (Arnett DK, et al., 2019) is: 21.5% Due to family history of stroke, hyperlipidemia, I would encourage her to consider a low-dose cholesterol medication such as Crestor 5 mg.  Please let me know if she would consider.  If so you may send in Crestor 5 mg p.o. in the evening x 90 days, 3 refills.  Please order hepatic function panel, lipid panel and schedule 6 weeks after starting  A1c reflects prediabetes

## 2023-10-07 NOTE — Telephone Encounter (Signed)
Patient just called back. I read her the message. She declined to the Crestor 5mg . She said she will go back on her diet. Her number is [phone].

## 2023-12-09 ENCOUNTER — Encounter: Payer: Medicare Other | Admitting: Nurse Practitioner

## 2024-10-19 ENCOUNTER — Telehealth: Payer: Self-pay

## 2024-10-19 ENCOUNTER — Other Ambulatory Visit: Payer: Self-pay | Admitting: Family

## 2024-10-19 DIAGNOSIS — E559 Vitamin D deficiency, unspecified: Secondary | ICD-10-CM

## 2024-10-19 DIAGNOSIS — R7309 Other abnormal glucose: Secondary | ICD-10-CM

## 2024-10-19 DIAGNOSIS — I10 Essential (primary) hypertension: Secondary | ICD-10-CM

## 2024-10-19 DIAGNOSIS — M8580 Other specified disorders of bone density and structure, unspecified site: Secondary | ICD-10-CM

## 2024-10-19 DIAGNOSIS — E785 Hyperlipidemia, unspecified: Secondary | ICD-10-CM

## 2024-10-19 NOTE — Telephone Encounter (Signed)
 Copied from CRM #[id number]. Topic: Clinical - Medication Refill >> Oct 19, 2024 11:36 AM Rea ORN wrote: Medication:  amLODipine  (NORVASC ) 5 MG tablet   Has the patient contacted their pharmacy? No (Agent: If no, request that the patient contact the pharmacy for the refill. If patient does not wish to contact the pharmacy document the reason why and proceed with request.) (Agent: If yes, when and what did the pharmacy advise?)  This is the patient's preferred pharmacy:  [facility] [address] - [place], [place] - [address] [facility] [facility] [address] [place] [address] Phone: [phone] Fax: [phone]  Is this the correct pharmacy for this prescription? Yes If no, delete pharmacy and type the correct one.   Has the prescription been filled recently? No  Is the patient out of the medication? No  Has the patient been seen for an appointment in the last year OR does the patient have an upcoming appointment? Yes  Can we respond through MyChart? No  Agent: Please be advised that Rx refills may take up to 3 business days. We ask that you follow-up with your pharmacy.

## 2024-10-19 NOTE — Addendum Note (Signed)
 Addended by: Holt Woolbright on: 10/19/2024 02:34 PM   Modules accepted: Orders

## 2024-10-19 NOTE — Telephone Encounter (Signed)
 Copied from CRM #[id number]. Topic: Clinical - Request for Lab/Test Order >> Oct 19, 2024 11:33 AM Rea ORN wrote: Reason for CRM: Pt would like yearly labs ordered before 1/14 appt. Pt would like this done before the end of the year.    Please call back [phone] once labs are placed.

## 2024-10-20 MED ORDER — AMLODIPINE BESYLATE 5 MG PO TABS: 5.0000 mg | ORAL_TABLET | Freq: Every day | ORAL | 3 refills | Status: DC

## 2024-10-26 ENCOUNTER — Other Ambulatory Visit

## 2024-10-26 ENCOUNTER — Ambulatory Visit: Payer: Self-pay | Admitting: Family

## 2024-10-26 DIAGNOSIS — I10 Essential (primary) hypertension: Secondary | ICD-10-CM

## 2024-10-26 DIAGNOSIS — R7309 Other abnormal glucose: Secondary | ICD-10-CM | POA: Diagnosis not present

## 2024-10-26 DIAGNOSIS — E785 Hyperlipidemia, unspecified: Secondary | ICD-10-CM | POA: Diagnosis not present

## 2024-10-26 DIAGNOSIS — E559 Vitamin D deficiency, unspecified: Secondary | ICD-10-CM | POA: Diagnosis not present

## 2024-10-26 LAB — LIPID PANEL
Cholesterol: 221 mg/dL — ABNORMAL HIGH (ref 28–200)
HDL: 52.2 mg/dL (ref >39.00)
LDL Cholesterol: 144 mg/dL — ABNORMAL HIGH (ref 10–99)
NonHDL: 168.72
Total CHOL/HDL Ratio: 4
Triglycerides: 122 mg/dL (ref 10.0–149.0)
VLDL: 24.4 mg/dL (ref 0.0–40.0)

## 2024-10-26 LAB — COMPREHENSIVE METABOLIC PANEL WITH GFR
ALT: 14 U/L (ref 3–35)
AST: 13 U/L (ref 5–37)
Albumin: 4.4 g/dL (ref 3.5–5.2)
Alkaline Phosphatase: 69 U/L (ref 39–117)
BUN: 17 mg/dL (ref 6–23)
CO2: 31 meq/L (ref 19–32)
Calcium: 9.5 mg/dL (ref 8.4–10.5)
Chloride: 102 meq/L (ref 96–112)
Creatinine, Ser: 0.88 mg/dL (ref 0.40–1.20)
GFR: 65.2 mL/min (ref >60.00)
Glucose, Bld: 110 mg/dL — ABNORMAL HIGH (ref 70–99)
Potassium: 4.5 meq/L (ref 3.5–5.1)
Sodium: 139 meq/L (ref 135–145)
Total Bilirubin: 0.5 mg/dL (ref 0.2–1.2)
Total Protein: 7.1 g/dL (ref 6.0–8.3)

## 2024-10-26 LAB — CBC WITH DIFFERENTIAL/PLATELET
Basophils Absolute: 0.1 K/uL (ref 0.0–0.1)
Basophils Relative: 0.8 % (ref 0.0–3.0)
Eosinophils Absolute: 0.1 K/uL (ref 0.0–0.7)
Eosinophils Relative: 1.1 % (ref 0.0–5.0)
HCT: 40.7 % (ref 36.0–46.0)
Hemoglobin: 13.8 g/dL (ref 12.0–15.0)
Lymphocytes Relative: 23.4 % (ref 12.0–46.0)
Lymphs Abs: 1.4 K/uL (ref 0.7–4.0)
MCHC: 33.8 g/dL (ref 30.0–36.0)
MCV: 85.3 fl (ref 78.0–100.0)
Monocytes Absolute: 0.4 K/uL (ref 0.1–1.0)
Monocytes Relative: 7.2 % (ref 3.0–12.0)
Neutro Abs: 4.1 K/uL (ref 1.4–7.7)
Neutrophils Relative %: 67.5 % (ref 43.0–77.0)
Platelets: 278 K/uL (ref 150.0–400.0)
RBC: 4.77 Mil/uL (ref 3.87–5.11)
RDW: 13.3 % (ref 11.5–15.5)
WBC: 6.1 K/uL (ref 4.0–10.5)

## 2024-10-26 LAB — TSH: TSH: 3.51 u[IU]/mL (ref 0.35–5.50)

## 2024-10-26 LAB — HEMOGLOBIN A1C: Hgb A1c MFr Bld: 5.8 % (ref 4.6–6.5)

## 2024-10-26 LAB — VITAMIN D 25 HYDROXY (VIT D DEFICIENCY, FRACTURES): VITD: 28.28 ng/mL — ABNORMAL LOW (ref 30.00–100.00)

## 2024-10-31 NOTE — Telephone Encounter (Signed)
 Labs printed and letter created with providers message. Lab has also been mailed to pt per her request called and informed pt as well.

## 2024-10-31 NOTE — Telephone Encounter (Signed)
 Copied from CRM #[id number]. Topic: Clinical - Lab/Test Results >> Oct 31, 2024  3:50 PM [name] wrote: Reason for CRM: Patient would like to have lab results from 12/17 mailed to her home.

## 2024-11-23 ENCOUNTER — Encounter: Payer: Self-pay | Admitting: Family

## 2024-11-23 ENCOUNTER — Ambulatory Visit (INDEPENDENT_AMBULATORY_CARE_PROVIDER_SITE_OTHER): Admitting: Family

## 2024-11-23 ENCOUNTER — Telehealth: Payer: Self-pay | Admitting: Family

## 2024-11-23 VITALS — BP 146/78 | HR 70 | Temp 98.4°F | Ht 63.0 in | Wt 166.4 lb

## 2024-11-23 DIAGNOSIS — E785 Hyperlipidemia, unspecified: Secondary | ICD-10-CM | POA: Diagnosis not present

## 2024-11-23 DIAGNOSIS — I10 Essential (primary) hypertension: Secondary | ICD-10-CM

## 2024-11-23 DIAGNOSIS — Z0000 Encounter for general adult medical examination without abnormal findings: Secondary | ICD-10-CM | POA: Insufficient documentation

## 2024-11-23 MED ORDER — AMLODIPINE BESYLATE 2.5 MG PO TABS: 2.5000 mg | ORAL_TABLET | Freq: Every day | ORAL | 3 refills | Status: AC

## 2024-11-23 NOTE — Assessment & Plan Note (Signed)
 Recalculated 10-year ASCVD risk with patient today,  22.2%.  Discussed recommendation for moderate to high intensity statin.  She politely declines at this time.  Advised consideration for CT calcium score.  She will consider this in the future.

## 2024-11-23 NOTE — Telephone Encounter (Signed)
 Call [facility] notes in 2025

## 2024-11-23 NOTE — Assessment & Plan Note (Signed)
 She politely declines clinical breast exam in the office today.  Advised continue self breast exam at home.  Encouraged formal exercise.  She declines Tdap, pneumonia vaccine.

## 2024-11-23 NOTE — Patient Instructions (Addendum)
 Consider Ct calcium score in the near future  Increase amlodipine  to 7.5 mg daily total.  Goal of blood pressure is consistently less than 140/8 based on JNC 8 guidelines as discussed today  Managing Your Hypertension Hypertension, also called high blood pressure, is when the force of the blood pressing against the walls of the arteries is too strong. Arteries are blood vessels that carry blood from your heart throughout your body. Hypertension forces the heart to work harder to pump blood and may cause the arteries to become narrow or stiff. Understanding blood pressure readings A blood pressure reading includes a higher number over a lower number: The first, or top, number is called the systolic pressure. It is a measure of the pressure in your arteries as your heart beats. The second, or bottom number, is called the diastolic pressure. It is a measure of the pressure in your arteries as the heart relaxes. For most people, a normal blood pressure is below 120/80. Your personal target blood pressure may vary depending on your medical conditions, your age, and other factors. Blood pressure is classified into four stages. Based on your blood pressure reading, your health care provider may use the following stages to determine what type of treatment you need, if any. Systolic pressure and diastolic pressure are measured in a unit called millimeters of mercury (mmHg). Normal Systolic pressure: below 120. Diastolic pressure: below 80. Elevated Systolic pressure: 120-129. Diastolic pressure: below 80. Hypertension stage 1 Systolic pressure: 130-139. Diastolic pressure: 80-89. Hypertension stage 2 Systolic pressure: 140 or above. Diastolic pressure: 90 or above. How can this condition affect me? Managing your hypertension is very important. Over time, hypertension can damage the arteries and decrease blood flow to parts of the body, including the brain, heart, and kidneys. Having untreated or  uncontrolled hypertension can lead to: A heart attack. A stroke. A weakened blood vessel (aneurysm). Heart failure. Kidney damage. Eye damage. Memory and concentration problems. Vascular dementia. What actions can I take to manage this condition? Hypertension can be managed by making lifestyle changes and possibly by taking medicines. Your health care provider will help you make a plan to bring your blood pressure within a normal range. You may be referred for counseling on a healthy diet and physical activity. Nutrition  Eat a diet that is high in fiber and potassium, and low in salt (sodium), added sugar, and fat. An example eating plan is called the DASH diet. DASH stands for Dietary Approaches to Stop Hypertension. To eat this way: Eat plenty of fresh fruits and vegetables. Try to fill one-half of your plate at each meal with fruits and vegetables. Eat whole grains, such as whole-wheat pasta, brown rice, or whole-grain bread. Fill about one-fourth of your plate with whole grains. Eat low-fat dairy products. Avoid fatty cuts of meat, processed or cured meats, and poultry with skin. Fill about one-fourth of your plate with lean proteins such as fish, chicken without skin, beans, eggs, and tofu. Avoid pre-made and processed foods. These tend to be higher in sodium, added sugar, and fat. Reduce your daily sodium intake. Many people with hypertension should eat less than 1,500 mg of sodium a day. Lifestyle  Work with your health care provider to maintain a healthy body weight or to lose weight. Ask what an ideal weight is for you. Get at least 30 minutes of exercise that causes your heart to beat faster (aerobic exercise) most days of the week. Activities may include walking, swimming, or biking. Include  exercise to strengthen your muscles (resistance exercise), such as weight lifting, as part of your weekly exercise routine. Try to do these types of exercises for 30 minutes at least 3 days a  week. Do not use any products that contain nicotine or tobacco. These products include cigarettes, chewing tobacco, and vaping devices, such as e-cigarettes. If you need help quitting, ask your health care provider. Control any long-term (chronic) conditions you have, such as high cholesterol or diabetes. Identify your sources of stress and find ways to manage stress. This may include meditation, deep breathing, or making time for fun activities. Alcohol use Do not drink alcohol if: Your health care provider tells you not to drink. You are pregnant, may be pregnant, or are planning to become pregnant. If you drink alcohol: Limit how much you have to: 0-1 drink a day for women. 0-2 drinks a day for men. Know how much alcohol is in your drink. In the [place] one drink equals one 12 oz bottle of beer (355 mL), one 5 oz glass of wine (148 mL), or one 1 oz glass of hard liquor (44 mL). Medicines Your health care provider may prescribe medicine if lifestyle changes are not enough to get your blood pressure under control and if: Your systolic blood pressure is 130 or higher. Your diastolic blood pressure is 80 or higher. Take medicines only as told by your health care provider. Follow the directions carefully. Blood pressure medicines must be taken as told by your health care provider. The medicine does not work as well when you skip doses. Skipping doses also puts you at risk for problems. Monitoring Before you monitor your blood pressure: Do not smoke, drink caffeinated beverages, or exercise within 30 minutes before taking a measurement. Use the bathroom and empty your bladder (urinate). Sit quietly for at least 5 minutes before taking measurements. Monitor your blood pressure at home as told by your health care provider. To do this: Sit with your back straight and supported. Place your feet flat on the floor. Do not cross your legs. Support your arm on a flat surface, such as a table. Make sure  your upper arm is at heart level. Each time you measure, take two or three readings one minute apart and record the results. You may also need to have your blood pressure checked regularly by your health care provider. General information Talk with your health care provider about your diet, exercise habits, and other lifestyle factors that may be contributing to hypertension. Review all the medicines you take with your health care provider because there may be side effects or interactions. Keep all follow-up visits. Your health care provider can help you create and adjust your plan for managing your high blood pressure. Where to find more information National Heart, Lung, and Blood Institute: popsteam.is American Heart Association: www.heart.org Contact a health care provider if: You think you are having a reaction to medicines you have taken. You have repeated (recurrent) headaches. You feel dizzy. You have swelling in your ankles. You have trouble with your vision. Get help right away if: You develop a severe headache or confusion. You have unusual weakness or numbness, or you feel faint. You have severe pain in your chest or abdomen. You vomit repeatedly. You have trouble breathing. These symptoms may be an emergency. Get help right away. Call 911. Do not wait to see if the symptoms will go away. Do not drive yourself to the hospital. Summary Hypertension is when the force of  blood pumping through your arteries is too strong. If this condition is not controlled, it may put you at risk for serious complications. Your personal target blood pressure may vary depending on your medical conditions, your age, and other factors. For most people, a normal blood pressure is less than 120/80. Hypertension is managed by lifestyle changes, medicines, or both. Lifestyle changes to help manage hypertension include losing weight, eating a healthy, low-sodium diet, exercising more, stopping  smoking, and limiting alcohol. This information is not intended to replace advice given to you by your health care provider. Make sure you discuss any questions you have with your health care provider. Document Revised: 07/11/2021 Document Reviewed: 07/11/2021 Elsevier Patient Education  2024 [employer].Health Maintenance for Postmenopausal Women Menopause is a normal process in which your ability to get pregnant comes to an end. This process happens slowly over many months or years, usually between the ages of 33 and 55. Menopause is complete when you have missed your menstrual period for 12 months. It is important to talk with your health care provider about some of the most common conditions that affect women after menopause (postmenopausal women). These include heart disease, cancer, and bone loss (osteoporosis). Adopting a healthy lifestyle and getting preventive care can help to promote your health and wellness. The actions you take can also lower your chances of developing some of these common conditions. What are the signs and symptoms of menopause? During menopause, you may have the following symptoms: Hot flashes. These can be moderate or severe. Night sweats. Decrease in sex drive. Mood swings. Headaches. Tiredness (fatigue). Irritability. Memory problems. Problems falling asleep or staying asleep. Talk with your health care provider about treatment options for your symptoms. Do I need hormone replacement therapy? Hormone replacement therapy is effective in treating symptoms that are caused by menopause, such as hot flashes and night sweats. Hormone replacement carries certain risks, especially as you become older. If you are thinking about using estrogen or estrogen with progestin, discuss the benefits and risks with your health care provider. How can I reduce my risk for heart disease and stroke? The risk of heart disease, heart attack, and stroke increases as you age. One of  the causes may be a change in the body's hormones during menopause. This can affect how your body uses dietary fats, triglycerides, and cholesterol. Heart attack and stroke are medical emergencies. There are many things that you can do to help prevent heart disease and stroke. Watch your blood pressure High blood pressure causes heart disease and increases the risk of stroke. This is more likely to develop in people who have high blood pressure readings or are overweight. Have your blood pressure checked: Every 3-5 years if you are 44-63 years of age. Every year if you are 92 years old or older. Eat a healthy diet  Eat a diet that includes plenty of vegetables, fruits, low-fat dairy products, and lean protein. Do not eat a lot of foods that are high in solid fats, added sugars, or sodium. Get regular exercise Get regular exercise. This is one of the most important things you can do for your health. Most adults should: Try to exercise for at least 150 minutes each week. The exercise should increase your heart rate and make you sweat (moderate-intensity exercise). Try to do strengthening exercises at least twice each week. Do these in addition to the moderate-intensity exercise. Spend less time sitting. Even light physical activity can be beneficial. Other tips Work with your  health care provider to achieve or maintain a healthy weight. Do not use any products that contain nicotine or tobacco. These products include cigarettes, chewing tobacco, and vaping devices, such as e-cigarettes. If you need help quitting, ask your health care provider. Know your numbers. Ask your health care provider to check your cholesterol and your blood sugar (glucose). Continue to have your blood tested as directed by your health care provider. Do I need screening for cancer? Depending on your health history and family history, you may need to have cancer screenings at different stages of your life. This may include  screening for: Breast cancer. Cervical cancer. Lung cancer. Colorectal cancer. What is my risk for osteoporosis? After menopause, you may be at increased risk for osteoporosis. Osteoporosis is a condition in which bone destruction happens more quickly than new bone creation. To help prevent osteoporosis or the bone fractures that can happen because of osteoporosis, you may take the following actions: If you are 65-29 years old, get at least 1,000 mg of calcium and at least 600 international units (IU) of vitamin D  per day. If you are older than age 11 but younger than age 36, get at least 1,200 mg of calcium and at least 600 international units (IU) of vitamin D  per day. If you are older than age 17, get at least 1,200 mg of calcium and at least 800 international units (IU) of vitamin D  per day. Smoking and drinking excessive alcohol increase the risk of osteoporosis. Eat foods that are rich in calcium and vitamin D , and do weight-bearing exercises several times each week as directed by your health care provider. How does menopause affect my mental health? Depression may occur at any age, but it is more common as you become older. Common symptoms of depression include: Feeling depressed. Changes in sleep patterns. Changes in appetite or eating patterns. Feeling an overall lack of motivation or enjoyment of activities that you previously enjoyed. Frequent crying spells. Talk with your health care provider if you think that you are experiencing any of these symptoms. General instructions See your health care provider for regular wellness exams and vaccines. This may include: Scheduling regular health, dental, and eye exams. Getting and maintaining your vaccines. These include: Influenza vaccine. Get this vaccine each year before the flu season begins. Pneumonia vaccine. Shingles vaccine. Tetanus, diphtheria, and pertussis (Tdap) booster vaccine. Your health care provider may also recommend  other immunizations. Tell your health care provider if you have ever been abused or do not feel safe at home. Summary Menopause is a normal process in which your ability to get pregnant comes to an end. This condition causes hot flashes, night sweats, decreased interest in sex, mood swings, headaches, or lack of sleep. Treatment for this condition may include hormone replacement therapy. Take actions to keep yourself healthy, including exercising regularly, eating a healthy diet, watching your weight, and checking your blood pressure and blood sugar levels. Get screened for cancer and depression. Make sure that you are up to date with all your vaccines. This information is not intended to replace advice given to you by your health care provider. Make sure you discuss any questions you have with your health care provider. Document Revised: 03/18/2021 Document Reviewed: 03/18/2021 Elsevier Patient Education  2024 [employer].

## 2024-11-23 NOTE — Progress Notes (Signed)
 "  Assessment & Plan:  Primary hypertension Assessment & Plan: Chronic, slightly elevated.  Discussed hypertension guidelines, JNC 8.  Advised patient more aggressive blood pressure control to reduce risk for MI, CVA.  Discussed her family history.  She was agreeable to increasing amlodipine  to 7.5 mg daily.  She will check blood pressure at home.  Orders: -     amLODIPine  Besylate; Take 1 tablet (2.5 mg total) by mouth daily.  Dispense: 90 tablet; Refill: 3  Annual physical exam Assessment & Plan: She politely declines clinical breast exam in the office today.  Advised continue self breast exam at home.  Encouraged formal exercise.  She declines Tdap, pneumonia vaccine.   Hyperlipidemia, unspecified hyperlipidemia type Assessment & Plan: Recalculated 10-year ASCVD risk with patient today,  22.2%.  Discussed recommendation for moderate to high intensity statin.  She politely declines at this time.  Advised consideration for CT calcium score.  She will consider this in the future.      Return precautions given.   Risks, benefits, and alternatives of the medications and treatment plan prescribed today were discussed, and patient expressed understanding.   Education regarding symptom management and diagnosis given to patient on AVS either electronically or printed.  Return in about 6 months (around 05/23/2025).  Rollene Northern, FNP  Subjective:    Patient ID: Diana Monroe, female    DOB: 11/24/1950, 74 y.o.   MRN: [id number]  CC: Diana Monroe is a 74 y.o. female who presents today for physical exam and medication follow-up.    HPI: HPI Discussed the use of AI scribe software for clinical note transcription with the patient, who gave verbal consent to proceed.  History of Present Illness   Diana Monroe is a 74 year old female with hypertension who presents for a follow-up visit.  She is concerned about her high blood pressure and has been taking amlodipine  5 mg daily since  she was 75 years old. She previously tried lisinopril but discontinued it due to a persistent cough. Her home blood pressure readings fluctuate, typically around 140/78 mmHg. She attributes some variability to her active lifestyle, including babysitting her grandchildren and caring for her husband. Denies chest pain, shortness of breath, headaches, or vision changes.She is not a smoker and maintains an active lifestyle, although she does not engage in formal exercise.     BP at home 146/78   Colorectal Cancer Screening: UTD ,tubular 11/12/21 f/u in 7 years Dr Aundria  Breast Cancer Screening: Mammogram due; she declines Cervical Cancer Screening: No longer screening over the age of 85 as of age.  Last documented Pap smear 10/23/2019 2018 negative malignancy, HPV not detected  Bone Health screening/DEXA for 65+: due  Lung Cancer Screening: Doesn't have 20 year pack year history and age > 60 years yo 82 years        Tetanus - due        Pneumococcal - Candidate for. declines  Exercise: No formal exercise.   Alcohol use: Occasional Smoking/tobacco use: Nonsmoker.    Health Maintenance  Topic Date Due   DTaP/Tdap/Td vaccine (1 - Tdap) Never done   Pneumococcal Vaccine for age over 44 (1 of 1 - PCV) Never done   Medicare Annual Wellness Visit  09/10/2022   Flu Shot  02/07/2025*   Breast Cancer Screening  11/23/2025*   Colon Cancer Screening  11/12/2028   Osteoporosis screening with Bone Density Scan  Completed   Hepatitis C Screening  Completed   Meningitis B Vaccine  Aged Out   COVID-19 Vaccine  Discontinued   Zoster (Shingles) Vaccine  Discontinued  *Topic was postponed. The date shown is not the original due date.    ALLERGIES: Lisinopril  Medications Ordered Prior to Encounter[1]  Review of Systems  Constitutional:  Negative for chills and fever.  Respiratory:  Negative for cough.   Cardiovascular:  Negative for chest pain and palpitations.  Gastrointestinal:  Negative for  nausea and vomiting.      Objective:    BP (!) 146/78 Comment: average at home  Pulse 70   Temp 98.4 F (36.9 C) (Oral)   Ht 5' 3 (1.6 m)   Wt 166 lb 6.4 oz (75.5 kg)   SpO2 96%   BMI 29.48 kg/m   BP Readings from Last 3 Encounters:  11/23/24 (!) 146/78  10/01/23 (!) 150/80  07/29/22 136/68   Wt Readings from Last 3 Encounters:  11/23/24 166 lb 6.4 oz (75.5 kg)  10/01/23 163 lb 12.8 oz (74.3 kg)  07/29/22 165 lb 3.2 oz (74.9 kg)    Physical Exam Vitals reviewed.  Constitutional:      Appearance: She is well-developed.  Eyes:     Conjunctiva/sclera: Conjunctivae normal.  Cardiovascular:     Rate and Rhythm: Normal rate and regular rhythm.     Pulses: Normal pulses.     Heart sounds: Normal heart sounds.  Pulmonary:     Effort: Pulmonary effort is normal.     Breath sounds: Normal breath sounds. No wheezing, rhonchi or rales.  Skin:    General: Skin is warm and dry.  Neurological:     Mental Status: She is alert.  Psychiatric:        Speech: Speech normal.        Behavior: Behavior normal.        Thought Content: Thought content normal.            [1]  Current Outpatient Medications on File Prior to Visit  Medication Sig Dispense Refill   amLODipine  (NORVASC ) 5 MG tablet Take 1 tablet (5 mg total) by mouth daily. 90 tablet 3   Ascorbic Acid (VITAMIN C PO) Take by mouth.     ascorbic acid (VITAMIN C) 500 MG tablet Take 1 tablet by mouth 2 (two) times daily.     calcium-vitamin D  (OSCAL WITH D) 500-200 MG-UNIT TABS tablet Take by mouth.     magnesium 30 MG tablet Take 30 mg daily by mouth.     vitamin B-12 (CYANOCOBALAMIN) 100 MCG tablet Take 100 mcg daily by mouth.     zinc gluconate 50 MG tablet      dorzolamide-timolol (COSOPT) 22.3-6.8 MG/ML ophthalmic solution Place 1 drop into the left eye 2 (two) times daily. (Patient not taking: Reported on 11/23/2024)     ipratropium (ATROVENT ) 0.06 % nasal spray Place 2 sprays into both nostrils 3 (three) times  daily as needed for rhinitis. (Patient not taking: Reported on 11/23/2024) 15 mL 12   LATANOPROST OP Apply 1 drop to eye 1 day or 1 dose. B/l eyes 1x (Patient not taking: Reported on 11/23/2024)     No current facility-administered medications on file prior to visit.   "

## 2024-11-23 NOTE — Telephone Encounter (Signed)
 Spoke to [facility]  she will fax over ov notes for jan and feb of 2025

## 2024-11-23 NOTE — Assessment & Plan Note (Signed)
 Chronic, slightly elevated.  Discussed hypertension guidelines, JNC 8.  Advised patient more aggressive blood pressure control to reduce risk for MI, CVA.  Discussed her family history.  She was agreeable to increasing amlodipine  to 7.5 mg daily.  She will check blood pressure at home.

## 2024-11-25 ENCOUNTER — Other Ambulatory Visit: Payer: Self-pay

## 2024-11-25 DIAGNOSIS — I10 Essential (primary) hypertension: Secondary | ICD-10-CM

## 2024-11-25 MED ORDER — AMLODIPINE BESYLATE 5 MG PO TABS: 5.0000 mg | ORAL_TABLET | Freq: Every day | ORAL | 3 refills | Status: AC

## 2024-11-25 NOTE — Telephone Encounter (Signed)
 Refill has been corrected and sent to correct pharmacy

## 2025-06-01 ENCOUNTER — Ambulatory Visit: Admitting: Family
